# Patient Record
Sex: Male | Born: 1990 | Race: Black or African American | Hispanic: No | Marital: Single | Smoking: Never smoker
Health system: Southern US, Community
[De-identification: ages and names within clinical notes are randomized; demographics above are authoritative.]

## PROBLEM LIST (undated history)

## (undated) DIAGNOSIS — T148XXA Other injury of unspecified body region, initial encounter: Secondary | ICD-10-CM

## (undated) DIAGNOSIS — J45909 Unspecified asthma, uncomplicated: Secondary | ICD-10-CM

## (undated) HISTORY — DX: Unspecified asthma, uncomplicated: J45.909

## (undated) HISTORY — PX: HERNIA REPAIR: SHX51

---
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
## 2010-08-19 ENCOUNTER — Inpatient Hospital Stay (INDEPENDENT_AMBULATORY_CARE_PROVIDER_SITE_OTHER)
Admission: RE | Admit: 2010-08-19 | Discharge: 2010-08-19 | Disposition: A | Discharge status: Home / Self Care | Payer: Self-pay | Source: Ambulatory Visit | Attending: Family Medicine | Admitting: Family Medicine

## 2010-08-19 DIAGNOSIS — N342 Other urethritis: Secondary | ICD-10-CM

## 2010-08-20 LAB — GC/CHLAMYDIA PROBE AMP, GENITAL
Chlamydia, DNA Probe: NEGATIVE
GC Probe Amp, Genital: NEGATIVE

## 2012-10-05 ENCOUNTER — Ambulatory Visit (INDEPENDENT_AMBULATORY_CARE_PROVIDER_SITE_OTHER): Payer: BC Managed Care – PPO | Admitting: Physician Assistant

## 2012-10-05 VITALS — BP 102/75 | HR 68 | Temp 98.2°F | Resp 18 | Ht 67.0 in | Wt 155.0 lb

## 2012-10-05 DIAGNOSIS — R369 Urethral discharge, unspecified: Secondary | ICD-10-CM

## 2012-10-05 MED ORDER — CEFTRIAXONE SODIUM 1 G IJ SOLR: 250.0000 mg | INTRAMUSCULAR | Status: DC

## 2012-10-05 MED ORDER — AZITHROMYCIN 500 MG PO TABS: 1000.0000 mg | ORAL_TABLET | Freq: Once | ORAL | Status: DC

## 2012-10-05 MED ORDER — CEFTRIAXONE SODIUM 1 G IJ SOLR
250.0000 mg | Freq: Once | INTRAMUSCULAR | Status: AC
  Administered 2012-10-05: 250 mg via INTRAMUSCULAR

## 2012-10-05 NOTE — Progress Notes (Signed)
  Subjective:    Patient ID: Roberto Cummings, male    DOB: Nov 08, 1990, 22 y.o.   MRN: [id number]  HPI   Roberto Cummings is a 22 yr old male here with concern for STI.  Has noted discharge from the penis for about a week.  "I think it's chlamydia."  Denies pain or dysuria.  States he has had chlamydia once before.  He is currently sexually active with 1 male partner, states he's been with this partner for about 4 yrs.  States she had a UTI about a week ago and shortly after he began having symptoms.  His partner is being seen today at the health dept.  Pt states he never uses condoms with this partner.  Last tested for HIV, syphilis about 6 months ago.  Does not want to be test for these today.     Review of Systems  Constitutional: Negative for fever and chills.  HENT: Negative.   Respiratory: Negative.   Cardiovascular: Negative.   Gastrointestinal: Negative.   Genitourinary: Positive for discharge. Negative for dysuria and genital sores.  Musculoskeletal: Negative.   Skin: Negative.   Neurological: Negative.        Objective:   Physical Exam  Vitals reviewed. Constitutional: He is oriented to person, place, and time. He appears well-developed and well-nourished. No distress.  HENT:  Head: Normocephalic and atraumatic.  Eyes: Conjunctivae are normal. No scleral icterus.  Cardiovascular: Normal rate, regular rhythm and normal heart sounds.   Pulmonary/Chest: Effort normal. He has no wheezes. He has no rales.  Abdominal: Soft. Bowel sounds are normal. There is no tenderness.  Neurological: He is alert and oriented to person, place, and time.  Skin: Skin is warm and dry.  Psychiatric: He has a normal mood and affect. His behavior is normal.     Filed Vitals:   10/05/12 1523  BP: 102/75  Pulse: 68  Temp: 98.2 F (36.8 C)  Resp: 18        Assessment & Plan:  Penile discharge - Plan: GC/Chlamydia Probe Amp, azithromycin (ZITHROMAX) 500 MG tablet, cefTRIAXone (ROCEPHIN) injection 250  mg, DISCONTINUED: cefTRIAXone (ROCEPHIN) injection 250 mg   Roberto Cummings is a 22 yr old male with one week of penile discharge.  Uriprobe collected today. Pt declined HIV, RPR.  Will treat empirically for both gonorrhea and chlamydia with ceftriaxone and azithro.  Discussed with pt that partner also needs treatment.  Discussed abstinence for the next 7 days to prevent reinfection.  Encouraged condom use with every encounter.  If symptoms worsen or do not improve pt will RTC.

## 2012-10-05 NOTE — Patient Instructions (Addendum)
We have given you an injection of antibiotics here in clinic.  Take the azithromycin today - take the two pills together.  Make sure your partner gets treated as well.  You need to abstain from sex for the next 7 days to avoid reinfection.  Use condoms with every sexual encounter, aside from abstinence this is the only way to protect yourself from sexually transmitted infections.  If your symptoms worsen or do not improve, please let us know,   Sexually Transmitted Disease Sexually transmitted disease (STD) refers to any infection that is passed from person to person during sexual activity. This may happen by way of saliva, semen, blood, vaginal mucus, or urine. Common STDs include:  Gonorrhea.  Chlamydia.  Syphilis.  HIV/AIDS.  Genital herpes.  Hepatitis B and C.  Trichomonas.  Human papillomavirus (HPV).  Pubic lice. CAUSES  An STD may be spread by bacteria, virus, or parasite. A person can get an STD by:  Sexual intercourse with an infected person.  Sharing sex toys with an infected person.  Sharing needles with an infected person.  Having intimate contact with the genitals, mouth, or rectal areas of an infected person. SYMPTOMS  Some people may not have any symptoms, but they can still pass the infection to others. Different STDs have different symptoms. Symptoms include:  Painful or bloody urination.  Pain in the pelvis, abdomen, vagina, anus, throat, or eyes.  Skin rash, itching, irritation, growths, or sores (lesions). These usually occur in the genital or anal area.  Abnormal vaginal discharge.  Penile discharge in men.  Soft, flesh-colored skin growths in the genital or anal area.  Fever.  Pain or bleeding during sexual intercourse.  Swollen glands in the groin area.  Yellow skin and eyes (jaundice). This is seen with hepatitis. DIAGNOSIS  To make a diagnosis, your caregiver may:  Take a medical history.  Perform a physical exam.  Take a specimen  (culture) to be examined.  Examine a sample of discharge under a microscope.  Perform blood tests.  Perform a Pap test, if this applies.  Perform a colposcopy.  Perform a laparoscopy. TREATMENT   Chlamydia, gonorrhea, trichomonas, and syphilis can be cured with antibiotic medicine.  Genital herpes, hepatitis, and HIV can be treated, but not cured, with prescribed medicines. The medicines will lessen the symptoms.  Genital warts from HPV can be treated with medicine or by freezing, burning (electrocautery), or surgery. Warts may come back.  HPV is a virus and cannot be cured with medicine or surgery.However, abnormal areas may be followed very closely by your caregiver and may be removed from the cervix, vagina, or vulva through office procedures or surgery. If your diagnosis is confirmed, your recent sexual partners need treatment. This is true even if they are symptom-free or have a negative culture or evaluation. They should not have sex until their caregiver says it is okay. HOME CARE INSTRUCTIONS  All sexual partners should be informed, tested, and treated for all STDs.  Take your antibiotics as directed. Finish them even if you start to feel better.  Only take over-the-counter or prescription medicines for pain, discomfort, or fever as directed by your caregiver.  Rest.  Eat a balanced diet and drink enough fluids to keep your urine clear or pale yellow.  Do not have sex until treatment is completed and you have followed up with your caregiver. STDs should be checked after treatment.  Keep all follow-up appointments, Pap tests, and blood tests as directed by your  caregiver.  Only use latex condoms and water-soluble lubricants during sexual activity. Do not use petroleum jelly or oils.  Avoid alcohol and illegal drugs.  Get vaccinated for HPV and hepatitis. If you have not received these vaccines in the past, talk to your caregiver about whether one or both might be  right for you.  Avoid risky sex practices that can break the skin. The only way to avoid getting an STD is to avoid all sexual activity.Latex condoms and dental dams (for oral sex) will help lessen the risk of getting an STD, but will not completely eliminate the risk. SEEK MEDICAL CARE IF:   You have a fever.  You have any new or worsening symptoms. Document Released: 09/04/2002 Document Revised: 09/06/2011 Document Reviewed: 09/11/2010 [employer] Patient Information 2013 [place], [place].

## 2012-10-06 LAB — GC/CHLAMYDIA PROBE AMP
CT Probe RNA: NEGATIVE
GC Probe RNA: NEGATIVE

## 2013-03-11 ENCOUNTER — Emergency Department (HOSPITAL_COMMUNITY)
Admission: EM | Admit: 2013-03-11 | Discharge: 2013-03-11 | Discharge status: Against Medical Advice | Payer: 59 | Attending: Emergency Medicine | Admitting: Emergency Medicine

## 2013-03-11 ENCOUNTER — Emergency Department (HOSPITAL_COMMUNITY): Payer: Self-pay

## 2013-03-11 ENCOUNTER — Emergency Department (HOSPITAL_COMMUNITY)
Admission: EM | Admit: 2013-03-11 | Discharge: 2013-03-11 | Disposition: A | Discharge status: Home / Self Care | Payer: Self-pay | Attending: Emergency Medicine | Admitting: Emergency Medicine

## 2013-03-11 ENCOUNTER — Encounter (HOSPITAL_COMMUNITY): Payer: Self-pay | Admitting: Emergency Medicine

## 2013-03-11 ENCOUNTER — Emergency Department (HOSPITAL_COMMUNITY): Payer: 59

## 2013-03-11 DIAGNOSIS — S43016A Anterior dislocation of unspecified humerus, initial encounter: Secondary | ICD-10-CM | POA: Insufficient documentation

## 2013-03-11 DIAGNOSIS — J45909 Unspecified asthma, uncomplicated: Secondary | ICD-10-CM | POA: Insufficient documentation

## 2013-03-11 DIAGNOSIS — Z87891 Personal history of nicotine dependence: Secondary | ICD-10-CM | POA: Insufficient documentation

## 2013-03-11 DIAGNOSIS — S43004A Unspecified dislocation of right shoulder joint, initial encounter: Secondary | ICD-10-CM

## 2013-03-11 MED ORDER — PROPOFOL 10 MG/ML IV BOLUS
0.5000 mg/kg | INTRAVENOUS | Status: DC | PRN
  Administered 2013-03-11: 70.3 mg via INTRAVENOUS
  Administered 2013-03-11: 35 mg via INTRAVENOUS
  Filled 2013-03-11: qty 20

## 2013-03-11 MED ORDER — HYDROMORPHONE HCL PF 1 MG/ML IJ SOLN
1.0000 mg | INTRAMUSCULAR | Status: DC | PRN
  Administered 2013-03-11 (×2): 1 mg via INTRAVENOUS
  Filled 2013-03-11 (×2): qty 1

## 2013-03-11 MED ORDER — HYDROCODONE-ACETAMINOPHEN 5-325 MG PO TABS: 1.0000 | ORAL_TABLET | Freq: Four times a day (QID) | ORAL | Status: DC | PRN

## 2013-03-11 MED ORDER — SODIUM CHLORIDE 0.9 % IV SOLN
INTRAVENOUS | Status: DC
  Administered 2013-03-11: 08:00:00 via INTRAVENOUS
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
## 2013-03-11 NOTE — ED Notes (Signed)
Reduction of R shoulder successful. Pt tolerated without difficulty. Vital signs stable. X-ray at bedside.

## 2013-03-11 NOTE — ED Notes (Signed)
EDP and PA at bedside. Consent for procedure completed. Time out performed. Vital signs stable.

## 2013-03-11 NOTE — ED Notes (Signed)
Pt is alert and responding appropriately. Vital signs stable. Able to cough on command. Answering questions correctly. No signs of acute distress noted. Family at bedside. Returned to baseline. Sedation score of 10.

## 2013-03-11 NOTE — ED Notes (Signed)
Pt sleeping O2sat decreased to 90%ra O2 applied 2LPM

## 2013-03-11 NOTE — ED Notes (Signed)
Pt discharged home. Vital signs stable. Instructed to wear sling until follow up with orthopedic surgeon. Has no further questions at the time.

## 2013-03-11 NOTE — ED Provider Notes (Signed)
R shoulder reduction performed by me under request of Dr. Lynelle Doctor:  Reduction of dislocation Date/Time: 03/11/2013 9:20 AM Performed by: Fayrene Helper Authorized by: Fayrene Helper Consent: Verbal consent obtained. written consent obtained. Risks and benefits: risks, benefits and alternatives were discussed Consent given by: patient Patient understanding: patient states understanding of the procedure being performed Patient consent: the patient's understanding of the procedure matches consent given Procedure consent: procedure consent matches procedure scheduled Relevant documents: relevant documents present and verified Test results: test results available and properly labeled Imaging studies: imaging studies available Patient identity confirmed: verbally with patient and arm band Time out: Immediately prior to procedure a "time out" was called to verify the correct patient, procedure, equipment, support staff and site/side marked as required. Local anesthesia used: no Patient sedated: yes Sedatives: propofol Sedation start date/time: 03/11/2013 9:00 AM Sedation end date/time: 03/11/2013 9:15 AM Vitals: Vital signs were monitored during sedation. Patient tolerance: Patient tolerated the procedure well with no immediate complications. Comments: Reduction performed by me under supervision of Dr. Lynelle Doctor.  Procedural sedation were used.  Technique includes traction/counter traction with internal/external rotation of R upper arm until shoulder was successfully reduced, confirmed on post reduction xray.      Fayrene Helper, PA-C 03/11/13 1103
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
## 2013-05-11 ENCOUNTER — Emergency Department (HOSPITAL_COMMUNITY)
Admission: EM | Admit: 2013-05-11 | Discharge: 2013-05-11 | Disposition: A | Discharge status: Home / Self Care | Payer: BC Managed Care – PPO | Attending: Emergency Medicine | Admitting: Emergency Medicine

## 2013-05-11 ENCOUNTER — Encounter (HOSPITAL_COMMUNITY): Payer: Self-pay | Admitting: Emergency Medicine

## 2013-05-11 DIAGNOSIS — R369 Urethral discharge, unspecified: Secondary | ICD-10-CM

## 2013-05-11 DIAGNOSIS — J45909 Unspecified asthma, uncomplicated: Secondary | ICD-10-CM | POA: Insufficient documentation

## 2013-05-11 DIAGNOSIS — Z87891 Personal history of nicotine dependence: Secondary | ICD-10-CM | POA: Insufficient documentation

## 2013-05-11 MED ORDER — CEFTRIAXONE SODIUM 250 MG IJ SOLR
250.0000 mg | Freq: Once | INTRAMUSCULAR | Status: AC
  Administered 2013-05-11: 250 mg via INTRAMUSCULAR
  Filled 2013-05-11: qty 250

## 2013-05-11 MED ORDER — FLUCONAZOLE 200 MG PO TABS
200.0000 mg | ORAL_TABLET | Freq: Once | ORAL | Status: AC
  Administered 2013-05-11: 200 mg via ORAL
  Filled 2013-05-11: qty 1

## 2013-05-11 MED ORDER — AZITHROMYCIN 250 MG PO TABS
1000.0000 mg | ORAL_TABLET | Freq: Once | ORAL | Status: AC
  Administered 2013-05-11: 1000 mg via ORAL
  Filled 2013-05-11: qty 4

## 2013-05-11 NOTE — ED Notes (Signed)
Pt states that he has white penial discharge for couple of days and bump on penis that is painful. Pt states that his girlfriend has recently been treated for yeast infection.

## 2013-05-11 NOTE — ED Provider Notes (Signed)
CSN: [id number]     Arrival date & time 05/11/13  1026 History   First MD Initiated Contact with Patient 05/11/13 1030     Chief Complaint  Patient presents with  . Penile Discharge   (Consider location/radiation/quality/duration/timing/severity/associated sxs/prior Treatment) HPI   Roberto Cummings is a 22 y.o.male without any significant PMH presents to the ER with complaints of penile discharge for 3 days. His girlfriend told him that she has a yeast infection and he thinks this may be what he has as well because of the discharge. It is a clear/yellow discharge. He has not had dysuria, nausea, vomiting, diarrhea, fevers, abdominal pains.     Past Medical History  Diagnosis Date  . Asthma    History reviewed. No pertinent past surgical history. No family history on file. History  Substance Use Topics  . Smoking status: Former Games developer  . Smokeless tobacco: Not on file  . Alcohol Use: Yes    Review of Systems The patient denies anorexia, fever, weight loss,, vision loss, decreased hearing, hoarseness, chest pain, syncope, dyspnea on exertion, peripheral edema, balance deficits, hemoptysis, abdominal pain, melena, hematochezia, severe indigestion/heartburn, hematuria, incontinence, genital sores, muscle weakness, suspicious skin lesions, transient blindness, difficulty walking, depression, unusual weight change, abnormal bleeding, enlarged lymph nodes, angioedema, and breast masses.  Allergies  Review of patient's allergies indicates no known allergies.  Home Medications   Current Outpatient Rx  Name  Route  Sig  Dispense  Refill  . HYDROcodone-acetaminophen (NORCO) 5-325 MG per tablet   Oral   Take 1-2 tablets by mouth every 6 (six) hours as needed for pain.   30 tablet   0    BP 111/72  Pulse 55  Temp(Src) 98.2 F (36.8 C) (Oral)  Resp 17  SpO2 98% Physical Exam  Nursing note and vitals reviewed. Constitutional: He appears well-developed and well-nourished. No  distress.  HENT:  Head: Normocephalic and atraumatic.  Eyes: Pupils are equal, round, and reactive to light.  Neck: Normal range of motion. Neck supple.  Cardiovascular: Normal rate and regular rhythm.   Pulmonary/Chest: Effort normal.  Abdominal: Soft.  Genitourinary: Testes normal. Circumcised. Discharge found.  Neurological: He is alert.  Skin: Skin is warm and dry.      ED Course  Procedures (including critical care time) Labs Review Labs Reviewed  GC/CHLAMYDIA PROBE AMP   Imaging Review No results found.  EKG Interpretation   None       MDM   1. Penile discharge    Patients infection does not appear to be yeast, but will give a dose of Diflucan. Pt given Azithromycin and Rocephin in ED and told not to have sex with his girlfriend until she gets checked and treated as well. Told he will be called Monday or Tuesday with results.  22 y.o.Roberto Cummings's evaluation in the Emergency Department is complete. It has been determined that no acute conditions requiring further emergency intervention are present at this time. The patient/guardian have been advised of the diagnosis and plan. We have discussed signs and symptoms that warrant return to the ED, such as changes or worsening in symptoms.  Vital signs are stable at discharge. Filed Vitals:   05/11/13 1041  BP:   Pulse:   Temp: 98.2 F (36.8 C)  Resp:     Patient/guardian has voiced understanding and agreed to follow-up with the PCP or specialist.      Dorthula Matas, PA-C 05/11/13 1100

## 2013-05-11 NOTE — Progress Notes (Signed)
   CARE MANAGEMENT ED NOTE 05/11/2013  Patient:  Roberto Cummings, Roberto Cummings   Account Number:  [id number]  Date Initiated:  05/11/2013  Documentation initiated by:  Edd Arbour  Subjective/Objective Assessment:   22 yr old [facility] [place] pt c/o white penial discharge for couple of days and bump on penis that is painful No pcp     Subjective/Objective Assessment Detail:   Pt voiced understanding of information CM discussed with him Pt reports he is covered by his mother coverage     Action/Plan:   CM spoke with pt and discussed how he can find an in network pcp using [facility] website or customer service number for follow up services   Action/Plan Detail:   Anticipated DC Date:  05/11/2013     Status Recommendation to Physician:   Result of Recommendation:    Other ED Services  Consult Working Plan    DC Planning Services  Other  PCP issues  Outpatient Services - Pt will follow up    Choice offered to / List presented to:            Status of service:  Completed, signed off  ED Comments:   ED Comments Detail:

## 2013-05-11 NOTE — ED Provider Notes (Signed)
Medical screening examination/treatment/procedure(s) were performed by non-physician practitioner and as supervising physician I was immediately available for consultation/collaboration.  EKG Interpretation   None         Audree Camel, MD 05/11/13 1252

## 2013-05-16 NOTE — ED Notes (Signed)
+  gonorrhea Patient treated with Rocephin And Zithromax-[facility] faxed 

## 2013-09-17 ENCOUNTER — Other Ambulatory Visit (HOSPITAL_COMMUNITY)
Admission: RE | Admit: 2013-09-17 | Discharge: 2013-09-17 | Disposition: A | Discharge status: Home / Self Care | Payer: BC Managed Care – PPO | Source: Ambulatory Visit | Attending: Emergency Medicine | Admitting: Emergency Medicine

## 2013-09-17 ENCOUNTER — Encounter (HOSPITAL_COMMUNITY): Payer: Self-pay | Admitting: Emergency Medicine

## 2013-09-17 ENCOUNTER — Emergency Department (HOSPITAL_COMMUNITY)
Admission: EM | Admit: 2013-09-17 | Discharge: 2013-09-17 | Disposition: A | Discharge status: Home / Self Care | Payer: BC Managed Care – PPO | Source: Home / Self Care | Attending: Emergency Medicine | Admitting: Emergency Medicine

## 2013-09-17 DIAGNOSIS — Z113 Encounter for screening for infections with a predominantly sexual mode of transmission: Secondary | ICD-10-CM | POA: Insufficient documentation

## 2013-09-17 DIAGNOSIS — N342 Other urethritis: Secondary | ICD-10-CM

## 2013-09-17 MED ORDER — AZITHROMYCIN 250 MG PO TABS
1000.0000 mg | ORAL_TABLET | Freq: Every day | ORAL | Status: DC
  Administered 2013-09-17: 1000 mg via ORAL

## 2013-09-17 MED ORDER — CEFTRIAXONE SODIUM 250 MG IJ SOLR
INTRAMUSCULAR | Status: AC
  Filled 2013-09-17: qty 250

## 2013-09-17 MED ORDER — CEFTRIAXONE SODIUM 250 MG IJ SOLR
250.0000 mg | Freq: Once | INTRAMUSCULAR | Status: AC
  Administered 2013-09-17: 250 mg via INTRAMUSCULAR

## 2013-09-17 MED ORDER — AZITHROMYCIN 250 MG PO TABS
ORAL_TABLET | ORAL | Status: AC
  Filled 2013-09-17: qty 4

## 2013-09-17 MED ORDER — LIDOCAINE HCL (PF) 1 % IJ SOLN
INTRAMUSCULAR | Status: AC
  Filled 2013-09-17: qty 5

## 2013-09-17 NOTE — Discharge Instructions (Signed)
Urethritis, Adult Urethritis is an inflammation of the tube through which urine exits your bladder (urethra).  CAUSES Urethritis is often caused by an infection in your urethra. The infection can be viral, like herpes. The infection can also be bacterial, like gonorrhea. RISK FACTORS Risk factors of urethritis include:  Having sex without using a condom.  Having multiple sexual partners.  Having poor hygiene. SIGNS AND SYMPTOMS Symptoms of urethritis are less noticeable in women than in men. These symptoms include:  Burning feeling when you urinate (dysuria).  Discharge from your urethra.  Blood in your urine (hematuria).  Urinating more than usual. DIAGNOSIS  To confirm a diagnosis of urethritis, your health care provider will do the following:  Ask about your sexual history.  Perform a physical exam.  Have you provide a sample of your urine for lab testing.  Use a cotton swab to gently collect a sample from your urethra for lab testing. TREATMENT  It is important to treat urethritis. Depending on the cause, untreated urethritis may lead to serious genital infections and possibly infertility. Urethritis caused by a bacterial infection is treated with antibiotics. All sexual partners must be treated.  HOME CARE INSTRUCTIONS  Do not have sex until the test results are known and treatment is completed, even if your symptoms go away before you finish treatment.  Finish all medicines that you are prescribed. SEEK MEDICAL CARE IF:   Your symptoms are not improved in 3 days.  Your symptoms are getting worse.  You develop abdominal pain or pelvic pain (in women).  You develop joint pain. SEEK IMMEDIATE MEDICAL CARE IF:   You have a fever with a temperature of 101.41F (38.8C) or greater.  You have severe pain in the belly, back, or side.  You have repeated vomiting. Document Released: 12/08/2000 Document Revised: 04/04/2013 Document Reviewed: 02/12/2013 [facility]  Patient Information 2014 [place], [place].  Sexually Transmitted Disease A sexually transmitted disease (STD) is a disease or infection that may be passed (transmitted) from person to person, usually during sexual activity. This may happen by way of saliva, semen, blood, vaginal mucus, or urine. Common STDs include:   Gonorrhea.   Chlamydia.   Syphilis.   HIV and AIDS.   Genital herpes.   Hepatitis B and C.   Trichomonas.   Human papillomavirus (HPV).   Pubic lice.   Scabies.  Mites.  Bacterial vaginosis. WHAT ARE CAUSES OF STDs? An STD may be caused by bacteria, a virus, or parasites. STDs are often transmitted during sexual activity if one person is infected. However, they may also be transmitted through nonsexual means. STDs may be transmitted after:   Sexual intercourse with an infected person.   Sharing sex toys with an infected person.   Sharing needles with an infected person or using unclean piercing or tattoo needles.  Having intimate contact with the genitals, mouth, or rectal areas of an infected person.   Exposure to infected fluids during birth. WHAT ARE THE SIGNS AND SYMPTOMS OF STDs? Different STDs have different symptoms. Some people may not have any symptoms. If symptoms are present, they may include:   Painful or bloody urination.   Pain in the pelvis, abdomen, vagina, anus, throat, or eyes.   Skin rash, itching, irritation, growths, sores (lesions), ulcerations, or warts in the genital or anal area.  Abnormal vaginal discharge with or without bad odor.   Penile discharge in men.   Fever.   Pain or bleeding during sexual intercourse.   Swollen glands  in the groin area.   Yellow skin and eyes (jaundice). This is seen with hepatitis.   Swollen testicles.  Infertility.  Sores and blisters in the mouth. HOW ARE STDs DIAGNOSED? To make a diagnosis, your health care provider may:   Take a medical history.   Perform  a physical exam.   Take a sample of any discharge for examination.  Swab the throat, cervix, opening to the penis, rectum, or vagina for testing.  Test a sample of your first morning urine.   Perform blood tests.   Perform a Pap smear, if this applies.   Perform a colposcopy.   Perform a laparoscopy.  HOW ARE STDs TREATED? Treatment depends on the STD. Some STDs may be treated but not cured.   Chlamydia, gonorrhea, trichomonas, and syphilis can be cured with antibiotics.   Genital herpes, hepatitis, and HIV can be treated, but not cured, with prescribed medicines. The medicines lessen symptoms.   Genital warts from HPV can be treated with medicine or by freezing, burning (electrocautery), or surgery. Warts may come back.   HPV cannot be cured with medicine or surgery. However, abnormal areas may be removed from the cervix, vagina, or vulva.   If your diagnosis is confirmed, your recent sexual partners need treatment. This is true even if they are symptom-free or have a negative culture or evaluation. They should not have sex until their health care providers say it is OK. HOW CAN I REDUCE MY RISK OF GETTING AN STD?  Use latex condoms, dental dams, and water-soluble lubricants during sexual activity. Do not use petroleum jelly or oils.  Get vaccinated for HPV and hepatitis. If you have not received these vaccines in the past, talk to your health care provider about whether one or both might be right for you.   Avoid risky sex practices that can break the skin.  WHAT SHOULD I DO IF I THINK I HAVE AN STD?  See your health care provider.   Inform all sexual partners. They should be tested and treated for any STDs.  Do not have sex until your health care provider says it is OK. WHEN SHOULD I GET HELP? Seek immediate medical care if:  You develop severe abdominal pain.  You are a man and notice swelling or pain in the testicles.  You are a woman and notice  swelling or pain in your vagina. Document Released: 09/04/2002 Document Revised: 04/04/2013 Document Reviewed: 01/02/2013 [facility] Patient Information 2014 [place]ExitCare, [place]LLC.

## 2013-09-17 NOTE — ED Provider Notes (Signed)
CSN: [id number]     Arrival date & time 09/17/13  1546 History   First MD Initiated Contact with Patient 09/17/13 1742     No chief complaint on file.  (Consider location/radiation/quality/duration/timing/severity/associated sxs/prior Treatment) HPI Comments: 23 year old male presents for evaluation of penile discharge for 1 week. He reports that he "stepped out" on his girlfriend with another male and the condom broke. He reports some mild dysuria as well. No other systemic symptoms. No testicular pain or abdominal pains.   Past Medical History  Diagnosis Date  . Asthma    History reviewed. No pertinent past surgical history. No family history on file. History  Substance Use Topics  . Smoking status: Former Games developermoker  . Smokeless tobacco: Not on file  . Alcohol Use: Yes    Review of Systems  Genitourinary: Positive for dysuria and discharge. Negative for urgency, frequency, hematuria, flank pain, penile swelling, scrotal swelling, enuresis, difficulty urinating, genital sores, penile pain and testicular pain.  All other systems reviewed and are negative.    Allergies  Review of patient's allergies indicates no known allergies.  Home Medications   Current Outpatient Rx  Name  Route  Sig  Dispense  Refill  . HYDROcodone-acetaminophen (NORCO) 5-325 MG per tablet   Oral   Take 1-2 tablets by mouth every 6 (six) hours as needed for pain.   30 tablet   0    BP 128/81  Pulse 50  Temp(Src) 98.5 F (36.9 C) (Oral)  Resp 16  SpO2 100% Physical Exam  Nursing note and vitals reviewed. Constitutional: He is oriented to person, place, and time. He appears well-developed and well-nourished. No distress.  HENT:  Head: Normocephalic.  Pulmonary/Chest: Effort normal. No respiratory distress.  Genitourinary: Testes normal. Right testis shows no swelling and no tenderness. Left testis shows no swelling and no tenderness. Discharge (thin white) found.  Lymphadenopathy:       Right:  Inguinal adenopathy present.       Left: Inguinal adenopathy present.  Neurological: He is alert and oriented to person, place, and time. Coordination normal.  Skin: Skin is warm and dry. No rash noted. He is not diaphoretic.  Psychiatric: He has a normal mood and affect. Judgment normal.    ED Course  Procedures (including critical care time) Labs Review Labs Reviewed  RPR  HIV ANTIBODY (ROUTINE TESTING)  URINE CYTOLOGY ANCILLARY ONLY   Imaging Review No results found.   MDM   1. Urethritis    Treated with azithromycin and ceftriaxone here. Urine cytology sent. HIV and RPR sent. Followup when necessary if symptoms do not resolve.   Meds ordered this encounter  Medications  . azithromycin (ZITHROMAX) tablet 1,000 mg    Sig:   . cefTRIAXone (ROCEPHIN) injection 250 mg    Sig:        Graylon GoodZachary H Daniela Siebers, PA-C 09/17/13 1801

## 2013-09-17 NOTE — ED Notes (Signed)
23 yr old male is here with complaints of penile discharge for one week. He states he stepped out on his girlfriend with another male, condom was used but broke. He states the discharge is whitish. Denies: SOB, Chest pain

## 2013-09-18 LAB — RPR: RPR: NONREACTIVE

## 2013-09-18 LAB — URINE CYTOLOGY ANCILLARY ONLY
CHLAMYDIA, DNA PROBE: POSITIVE — AB
NEISSERIA GONORRHEA: NEGATIVE
TRICH (WINDOWPATH): NEGATIVE

## 2013-09-18 LAB — HIV ANTIBODY (ROUTINE TESTING W REFLEX): HIV: NONREACTIVE

## 2013-09-18 NOTE — ED Provider Notes (Signed)
Medical screening examination/treatment/procedure(s) were performed by non-physician practitioner and as supervising physician I was immediately available for consultation/collaboration.  Leslee Homeavid Azrael Maddix, M.D.   Reuben Likesavid C Alif Petrak, MD 09/18/13 [phone]

## 2013-09-20 ENCOUNTER — Telehealth (HOSPITAL_COMMUNITY): Payer: Self-pay | Admitting: *Deleted

## 2013-09-20 NOTE — ED Notes (Addendum)
GC neg., Chlamydia pos., Trich neg., HIV/RPR non-reactive. Pt. adequately treated with Zithromax.  I called pt. but mailbox is full- unable to leave a message. Call 1. DHHS [facility] Roberto Cummings, Roberto Cummings 09/20/2013 Call 2.  09/21/2013 Pt. called back.  Pt. verified x 2 and given results.  Pt. told he was adequately treated with Zithromax.  Pt. instructed to notify his partners, no sex for 1 week and to practice safe sex. Pt. told he should [facility], by appointment, since he was pos. for an STD.  Pt. voiced understanding. Roberto Cummings, Roberto Cummings 09/21/2013

## 2014-08-23 ENCOUNTER — Emergency Department (HOSPITAL_COMMUNITY): Payer: BLUE CROSS/BLUE SHIELD

## 2014-08-23 ENCOUNTER — Encounter (HOSPITAL_COMMUNITY): Payer: Self-pay | Admitting: *Deleted

## 2014-08-23 ENCOUNTER — Emergency Department (HOSPITAL_COMMUNITY)
Admission: EM | Admit: 2014-08-23 | Discharge: 2014-08-23 | Disposition: A | Discharge status: Home / Self Care | Payer: BLUE CROSS/BLUE SHIELD | Attending: Emergency Medicine | Admitting: Emergency Medicine

## 2014-08-23 DIAGNOSIS — S29001A Unspecified injury of muscle and tendon of front wall of thorax, initial encounter: Secondary | ICD-10-CM | POA: Insufficient documentation

## 2014-08-23 DIAGNOSIS — S298XXA Other specified injuries of thorax, initial encounter: Secondary | ICD-10-CM

## 2014-08-23 DIAGNOSIS — J45909 Unspecified asthma, uncomplicated: Secondary | ICD-10-CM | POA: Diagnosis not present

## 2014-08-23 DIAGNOSIS — Y9289 Other specified places as the place of occurrence of the external cause: Secondary | ICD-10-CM | POA: Insufficient documentation

## 2014-08-23 DIAGNOSIS — Y998 Other external cause status: Secondary | ICD-10-CM | POA: Insufficient documentation

## 2014-08-23 DIAGNOSIS — Z87891 Personal history of nicotine dependence: Secondary | ICD-10-CM | POA: Diagnosis not present

## 2014-08-23 DIAGNOSIS — Y9383 Activity, rough housing and horseplay: Secondary | ICD-10-CM | POA: Diagnosis not present

## 2014-08-23 DIAGNOSIS — X58XXXA Exposure to other specified factors, initial encounter: Secondary | ICD-10-CM | POA: Insufficient documentation

## 2014-08-23 NOTE — ED Notes (Signed)
Dr. Wickline at bedside.  

## 2014-08-23 NOTE — ED Provider Notes (Signed)
CSN: [id number]     Arrival date & time 08/23/14  [id number] History   First MD Initiated Contact with Patient 08/23/14 0725     Chief Complaint  Patient presents with  . Flank Pain    RIB pain    Patient is a 24 y.o. male presenting with chest pain. The history is provided by the patient.  Chest Pain Pain location:  L chest Pain quality: aching   Pain radiates to:  Does not radiate Pain severity:  Moderate Onset quality:  Gradual Duration:  1 day Timing:  Intermittent Progression:  Unchanged Chronicity:  New Context comment:  "horse playing" Relieved by:  Rest Worsened by:  Coughing, movement and certain positions Associated symptoms: cough   Associated symptoms: no abdominal pain, no fever, no lower extremity edema, no shortness of breath, no syncope, not vomiting and no weakness   Associated symptoms comment:  Denies hemoptysis  Risk factors: no coronary artery disease and no prior DVT/PE    Patient reports "horseplaying" with friend yesterday and soon after noticed pain in left side of chest No other injury report Pain is worse with movement/coughing He has no other symptoms    Past Medical History  Diagnosis Date  . Asthma     No longer    No family history on file. History  Substance Use Topics  . Smoking status: Former Games developermoker  . Smokeless tobacco: Not on file  . Alcohol Use: Yes    Review of Systems  Constitutional: Negative for fever.  Respiratory: Positive for cough. Negative for shortness of breath.   Cardiovascular: Positive for chest pain. Negative for syncope.  Gastrointestinal: Negative for vomiting and abdominal pain.  Genitourinary: Negative for dysuria.  Neurological: Negative for syncope and weakness.  All other systems reviewed and are negative.     Allergies  Review of patient's allergies indicates no known allergies.  Home Medications   Prior to Admission medications   Not on File   BP 122/78 mmHg  Pulse 43  Temp(Src) 97.7 F (36.5  C) (Oral)  Resp 23  Ht 5\' 7"  (1.702 m)  Wt 160 lb (72.576 kg)  BMI 25.05 kg/m2  SpO2 100% Physical Exam CONSTITUTIONAL: Well developed/well nourished HEAD: Normocephalic/atraumatic EYES: EOMI/PERRL ENMT: Mucous membranes moist NECK: supple no meningeal signs SPINE/BACK:entire spine nontender CV: S1/S2 noted, no murmurs/rubs/gallops noted Chest - mild tenderness to left chest.  No bruising/crepitus noted LUNGS: Lungs are clear to auscultation bilaterally, no apparent distress ABDOMEN: soft, nontender, no rebound or guarding, bowel sounds noted throughout abdomen GU:no cva tenderness NEURO: Pt is awake/alert/appropriate, moves all extremitiesx4.  No facial droop.   EXTREMITIES: pulses normal/equal, full ROM, no LE edema and no calf tenderness SKIN: warm, color normal PSYCH: no abnormalities of mood noted, alert and oriented to situation  ED Course  Procedures   Patient declined pain meds He is in no distress Bradycardia noted but likely physiologic He only has pain with movement/palpation I doubt ACS/PE/Dissection Advised NSAID use He is appropriate for d/c home  Imaging Review Dg Chest 2 View  08/23/2014   CLINICAL DATA:  Left chest pain  EXAM: CHEST  2 VIEW  COMPARISON:  03/11/2013 heart size is mildly enlarged. Normal vascularity.  Negative for pneumonia. No mass or adenopathy. Minimal pleural effusion bilaterally has developed since prior study.  FINDINGS: Prominent heart size  Minimal pleural effusion bilaterally.  Negative for pneumonia.  IMPRESSION: No active cardiopulmonary disease.   Electronically Signed   By: Marlan Palauharles  Clark M.D.  On: 08/23/2014 08:07     EKG Interpretation   Date/Time:  Friday August 23 2014 07:54:31 EST Ventricular Rate:  43 PR Interval:  123 QRS Duration: 85 QT Interval:  432 QTC Calculation: 365 R Axis:   61 Text Interpretation:  Sinus bradycardia Non-specific ST-t changes No  previous ECGs available Confirmed by Bebe Shaggy  MD, Dorinda Hill  [phone]) on  08/23/2014 8:07:18 AM      MDM   Final diagnoses:  Blunt chest trauma, initial encounter    Nursing notes including past medical history and social history reviewed and considered in documentation xrays/imaging reviewed by myself and considered during evaluation      Joya Gaskins, MD 08/23/14 [phone]

## 2014-08-23 NOTE — Discharge Instructions (Signed)
Blunt Chest Trauma °Blunt chest trauma is an injury caused by a blow to the chest. These chest injuries can be very painful. Blunt chest trauma often results in bruised or broken (fractured) ribs. Most cases of bruised and fractured ribs from blunt chest traumas get better after 1 to 3 weeks of rest and pain medicine. Often, the soft tissue in the chest wall is also injured, causing pain and bruising. Internal organs, such as the heart and lungs, may also be injured. Blunt chest trauma can lead to serious medical problems. This injury requires immediate medical care. °CAUSES  °· Motor vehicle collisions. °· Falls. °· Physical violence. °· Sports injuries. °SYMPTOMS  °· Chest pain. The pain may be worse when you move or breathe deeply. °· Shortness of breath. °· Lightheadedness. °· Bruising. °· Tenderness. °· Swelling. °DIAGNOSIS  °Your caregiver will do a physical exam. X-rays may be taken to look for fractures. However, minor rib fractures may not show up on X-rays until a few days after the injury. If a more serious injury is suspected, further imaging tests may be done. This may include ultrasounds, computed tomography (CT) scans, or magnetic resonance imaging (MRI). °TREATMENT  °Treatment depends on the severity of your injury. Your caregiver may prescribe pain medicines and deep breathing exercises. °HOME CARE INSTRUCTIONS °· Limit your activities until you can move around without much pain. °· Do not do any strenuous work until your injury is healed. °· Put ice on the injured area. °¨ Put ice in a plastic bag. °¨ Place a towel between your skin and the bag. °¨ Leave the ice on for 15-20 minutes, 03-04 times a day. °· You may wear a rib belt as directed by your caregiver to reduce pain. °· Practice deep breathing as directed by your caregiver to keep your lungs clear. °· Only take over-the-counter or prescription medicines for pain, fever, or discomfort as directed by your caregiver. °SEEK IMMEDIATE MEDICAL  CARE IF:  °· You have increasing pain or shortness of breath. °· You cough up blood. °· You have nausea, vomiting, or abdominal pain. °· You have a fever. °· You feel dizzy, weak, or you faint. °MAKE SURE YOU: °· Understand these instructions. °· Will watch your condition. °· Will get help right away if you are not doing well or get worse. °Document Released: 07/22/2004 Document Revised: 09/06/2011 Document Reviewed: 03/31/2011 °ExitCare® Patient Information ©2015 [employer], LLC. This information is not intended to replace advice given to you by your health care provider. Make sure you discuss any questions you have with your health care provider. ° ° °

## 2014-08-23 NOTE — ED Notes (Signed)
Pt states he was wrestling with friend yesterday. Began feeling pain to L lat chest/ribs several hours later.  Denies having it hit or falling on object.  Pain increases with palpation.  No sob or pain on inspiration.

## 2016-06-13 ENCOUNTER — Emergency Department (HOSPITAL_COMMUNITY)
Admission: EM | Admit: 2016-06-13 | Discharge: 2016-06-13 | Disposition: A | Discharge status: Home / Self Care | Payer: BLUE CROSS/BLUE SHIELD | Attending: Emergency Medicine | Admitting: Emergency Medicine

## 2016-06-13 ENCOUNTER — Encounter (HOSPITAL_COMMUNITY): Payer: Self-pay

## 2016-06-13 DIAGNOSIS — Z202 Contact with and (suspected) exposure to infections with a predominantly sexual mode of transmission: Secondary | ICD-10-CM | POA: Insufficient documentation

## 2016-06-13 DIAGNOSIS — J45909 Unspecified asthma, uncomplicated: Secondary | ICD-10-CM | POA: Insufficient documentation

## 2016-06-13 DIAGNOSIS — Z87891 Personal history of nicotine dependence: Secondary | ICD-10-CM | POA: Insufficient documentation

## 2016-06-13 MED ORDER — AZITHROMYCIN 250 MG PO TABS
1000.0000 mg | ORAL_TABLET | Freq: Once | ORAL | Status: AC
  Administered 2016-06-13: 1000 mg via ORAL
  Filled 2016-06-13: qty 4

## 2016-06-13 MED ORDER — STERILE WATER FOR INJECTION IJ SOLN
INTRAMUSCULAR | Status: AC
  Administered 2016-06-13: 10 mL
  Filled 2016-06-13: qty 10

## 2016-06-13 MED ORDER — CEFTRIAXONE SODIUM 250 MG IJ SOLR
250.0000 mg | INTRAMUSCULAR | Status: DC
  Administered 2016-06-13: 250 mg via INTRAMUSCULAR
  Filled 2016-06-13 (×2): qty 250

## 2016-06-13 NOTE — ED Notes (Signed)
Patient is A & O x4.  He understood discharge instructions 

## 2016-06-13 NOTE — ED Triage Notes (Signed)
Pt presents with c/o exposure to STD. Pt reports he has been with someone who has a positive chlamydia result and is now having penile discharge.

## 2016-06-13 NOTE — ED Provider Notes (Signed)
[facility]-EMERGENCY DEPT Provider Note   CSN: [id number] Arrival date & time: 06/13/16  0807     History   Chief Complaint Chief Complaint  Patient presents with  . Exposure to STD    HPI Roberto Cummings is a 25 y.o. male. He presents requesting treatment for STD exposure. He received a call from sexual partner that she was positive for Chlamydia. He's been having discharge for the last 2 days. Minimal dysuria. No sores or blisters. No swelling in the groin. No additional symptoms.  HPI  Past Medical History:  Diagnosis Date  . Asthma    No longer    There are no active problems to display for this patient.   History reviewed. No pertinent surgical history.     Home Medications    Prior to Admission medications   Not on File    Family History No family history on file.  Social History Social History  Substance Use Topics  . Smoking status: Former Games developermoker  . Smokeless tobacco: Not on file  . Alcohol use Yes     Allergies   Patient has no known allergies.   Review of Systems Review of Systems  Constitutional: Negative for appetite change, chills, diaphoresis, fatigue and fever.  HENT: Negative for mouth sores, sore throat and trouble swallowing.   Eyes: Negative for visual disturbance.  Respiratory: Negative for cough, chest tightness, shortness of breath and wheezing.   Cardiovascular: Negative for chest pain.  Gastrointestinal: Negative for abdominal distention, abdominal pain, diarrhea, nausea and vomiting.  Endocrine: Negative for polydipsia, polyphagia and polyuria.  Genitourinary: Positive for discharge. Negative for dysuria, frequency and hematuria.  Musculoskeletal: Negative for gait problem.  Skin: Negative for color change, pallor and rash.  Neurological: Negative for dizziness, syncope, light-headedness and headaches.  Hematological: Does not bruise/bleed easily.  Psychiatric/Behavioral: Negative for behavioral problems and confusion.      Physical Exam Updated Vital Signs BP 154/74 (BP Location: Right Arm)   Pulse (!) 55   Temp 98.3 F (36.8 C) (Oral)   Resp 18   Ht 5\' 8"  (1.727 m)   Wt 150 lb (68 kg)   SpO2 100%   BMI 22.81 kg/m   Physical Exam  Constitutional: He is oriented to person, place, and time. He appears well-developed and well-nourished. No distress.  HENT:  Head: Normocephalic.  Eyes: Conjunctivae are normal. Pupils are equal, round, and reactive to light. No scleral icterus.  Neck: Normal range of motion. Neck supple. No thyromegaly present.  Cardiovascular: Normal rate and regular rhythm.  Exam reveals no gallop and no friction rub.   No murmur heard. Pulmonary/Chest: Effort normal and breath sounds normal. No respiratory distress. He has no wheezes. He has no rales.  Abdominal: Soft. Bowel sounds are normal. He exhibits no distension. There is no tenderness. There is no rebound.  Genitourinary:  Genitourinary Comments: Normal genitalia and adnexa  Musculoskeletal: Normal range of motion.  Neurological: He is alert and oriented to person, place, and time.  Skin: Skin is warm and dry. No rash noted.  Psychiatric: He has a normal mood and affect. His behavior is normal.     ED Treatments / Results  Labs (all labs ordered are listed, but only abnormal results are displayed) Labs Reviewed - No data to display  EKG  EKG Interpretation None       Radiology No results found.  Procedures Procedures (including critical care time)  Medications Ordered in ED Medications  cefTRIAXone (ROCEPHIN) injection 250 mg (not  administered)  azithromycin (ZITHROMAX) tablet 1,000 mg (not administered)     Initial Impression / Assessment and Plan / ED Course  I have reviewed the triage vital signs and the nursing notes.  Pertinent labs & imaging results that were available during my care of the patient were reviewed by me and considered in my medical decision making (see chart for  details).  Clinical Course     Rocephin 250 IM. Zithromax 1 g by mouth. Patient declined additional testing including HIV.  Final Clinical Impressions(s) / ED Diagnoses   Final diagnoses:  STD exposure    New Prescriptions New Prescriptions   No medications on file     Rolland PorterMark Leticia Mcdiarmid, MD 06/13/16 0840

## 2016-08-20 ENCOUNTER — Encounter (HOSPITAL_COMMUNITY): Payer: Self-pay

## 2016-08-20 ENCOUNTER — Ambulatory Visit (HOSPITAL_COMMUNITY)
Admission: EM | Admit: 2016-08-20 | Discharge: 2016-08-20 | Disposition: A | Discharge status: Home / Self Care | Payer: Self-pay | Attending: Family Medicine | Admitting: Family Medicine

## 2016-08-20 DIAGNOSIS — R369 Urethral discharge, unspecified: Secondary | ICD-10-CM | POA: Insufficient documentation

## 2016-08-20 DIAGNOSIS — Z87891 Personal history of nicotine dependence: Secondary | ICD-10-CM | POA: Insufficient documentation

## 2016-08-20 DIAGNOSIS — J069 Acute upper respiratory infection, unspecified: Secondary | ICD-10-CM | POA: Insufficient documentation

## 2016-08-20 DIAGNOSIS — J45909 Unspecified asthma, uncomplicated: Secondary | ICD-10-CM | POA: Insufficient documentation

## 2016-08-20 DIAGNOSIS — A64 Unspecified sexually transmitted disease: Secondary | ICD-10-CM | POA: Insufficient documentation

## 2016-08-20 DIAGNOSIS — R300 Dysuria: Secondary | ICD-10-CM | POA: Insufficient documentation

## 2016-08-20 DIAGNOSIS — B9789 Other viral agents as the cause of diseases classified elsewhere: Secondary | ICD-10-CM

## 2016-08-20 DIAGNOSIS — J029 Acute pharyngitis, unspecified: Secondary | ICD-10-CM | POA: Insufficient documentation

## 2016-08-20 MED ORDER — CEFTRIAXONE SODIUM 250 MG IJ SOLR
INTRAMUSCULAR | Status: AC
  Filled 2016-08-20: qty 250

## 2016-08-20 MED ORDER — AZITHROMYCIN 250 MG PO TABS
ORAL_TABLET | ORAL | Status: AC
  Filled 2016-08-20: qty 4

## 2016-08-20 MED ORDER — LIDOCAINE HCL (PF) 1 % IJ SOLN
INTRAMUSCULAR | Status: AC
  Filled 2016-08-20: qty 2

## 2016-08-20 MED ORDER — CEFTRIAXONE SODIUM 250 MG IJ SOLR
250.0000 mg | Freq: Once | INTRAMUSCULAR | Status: AC
  Administered 2016-08-20: 250 mg via INTRAMUSCULAR

## 2016-08-20 MED ORDER — AZITHROMYCIN 250 MG PO TABS
1000.0000 mg | ORAL_TABLET | Freq: Once | ORAL | Status: AC
  Administered 2016-08-20: 1000 mg via ORAL

## 2016-08-20 NOTE — ED Triage Notes (Signed)
Sore throat since wed. Along with runny nose, sneezing and coughing. Hot and cold spells. No fever. No otc meds taken.

## 2016-08-20 NOTE — ED Triage Notes (Signed)
Also has a penile discharge since wed[place]. Having a yellowish discharge, burns when he urinate but no other symptoms.

## 2016-08-20 NOTE — ED Provider Notes (Signed)
CSN: [id number]     Arrival date & time 08/20/16  1011 History   First MD Initiated Contact with Patient 08/20/16 1104     Chief Complaint  Patient presents with  . Sore Throat  . Penile Discharge   (Consider location/radiation/quality/duration/timing/severity/associated sxs/prior Treatment) 26 year old male presents to clinic with 4-5 day history of dysuria, and penile discharge. He denies any flank pain, nausea, vomiting, fever, or abdominal pain. He reports he is sexually active, does not consistently use condoms, denies any rashes, lesions, sores or other external complaints involving penis, scrotum, or groin area.  He also has complains of URI type symptoms such as congestion, runny nose, sore throat, and cough for 2-3 days. Cough is dry, hacking, nonproductive. He denies fever, muscle aches, bodyaches, or headaches, he denies any loss of appetite, weakness, or other symptoms.   The history is provided by the patient.    Past Medical History:  Diagnosis Date  . Asthma    No longer   History reviewed. No pertinent surgical history. No family history on file. Social History  Substance Use Topics  . Smoking status: Former Games developermoker  . Smokeless tobacco: Never Used  . Alcohol use Yes    Review of Systems  Reason unable to perform ROS: As covered in history of present illness.  All other systems reviewed and are negative.   Allergies  Patient has no known allergies.  Home Medications   Prior to Admission medications   Not on File   Meds Ordered and Administered this Visit   Medications  cefTRIAXone (ROCEPHIN) injection 250 mg (250 mg Intramuscular Given 08/20/16 1122)  azithromycin (ZITHROMAX) tablet 1,000 mg (1,000 mg Oral Given 08/20/16 1122)    BP 144/84 (BP Location: Right Arm)   Pulse 67   Temp 99 F (37.2 C) (Oral)   Resp 20   SpO2 96%  No data found.   Physical Exam  Constitutional: He is oriented to person, place, and time. He appears well-developed and  well-nourished. No distress.  HENT:  Head: Normocephalic and atraumatic.  Right Ear: Tympanic membrane and external ear normal.  Left Ear: Tympanic membrane and external ear normal.  Nose: Rhinorrhea present. Right sinus exhibits no maxillary sinus tenderness and no frontal sinus tenderness. Left sinus exhibits no maxillary sinus tenderness and no frontal sinus tenderness.  Mouth/Throat: Uvula is midline, oropharynx is clear and moist and mucous membranes are normal. No oropharyngeal exudate, posterior oropharyngeal edema or posterior oropharyngeal erythema. Tonsils are 2+ on the right. Tonsils are 2+ on the left. No tonsillar exudate.  Eyes: Pupils are equal, round, and reactive to light.  Neck: Normal range of motion. Neck supple. No JVD present.  Cardiovascular: Normal rate and regular rhythm.   Pulmonary/Chest: Effort normal and breath sounds normal. No respiratory distress. He has no wheezes.  Abdominal: Soft. Bowel sounds are normal.  Genitourinary:  Genitourinary Comments: Deferred, urine cyto collected.  Lymphadenopathy:       Head (right side): Tonsillar adenopathy present. No submental, no submandibular and no preauricular adenopathy present.       Head (left side): Tonsillar adenopathy present. No submental, no submandibular and no preauricular adenopathy present.    He has no cervical adenopathy.  Neurological: He is alert and oriented to person, place, and time.  Skin: Skin is warm and dry. Capillary refill takes less than 2 seconds. No rash noted. He is not diaphoretic. No erythema.  Psychiatric: He has a normal mood and affect.  Nursing note and vitals reviewed.  Urgent Care Course     Procedures (including critical care time)  Labs Review Labs Reviewed  URINE CYTOLOGY ANCILLARY ONLY    Imaging Review No results found.   Visual Acuity Review  Right Eye Distance:   Left Eye Distance:   Bilateral Distance:    Right Eye Near:   Left Eye Near:    Bilateral  Near:         MDM   1. Viral upper respiratory tract infection   2. STD (male)    You most likely have a viral URI, I advise rest, plenty of fluids and management of symptoms with over the counter medicines. For symptoms you may take Tylenol as needed every 4-6 hours for body aches or fever, not to exceed 4,000 mg a day, Take mucinex or mucinex DM ever 12 hours with a full glass of water, you may use an inhaled steroid such as Flonase, 2 sprays each nostril once a day for congestion, or an antihistamine such as Claritin or Zyrtec once a day.   You have been screened for multiple types of infection diseases such as Gonorrhea, Chlamydia. You will be notified of the results of these tests in 24-72 hours. If any therapy needs to be started or if your current therapy needs to be changed, you will be notified and and given instructions as to what to do. If your symptoms persist or fail to resolve, follow up with your primary care provider or return to clinic.  Based on your symptoms and findings on physical exam, you are being treated for an infection. You have received an injection of Rocephin, 250 mg and a tablet of Azithromycin, 1 gram. I would recommend following up with your primary care provider, the health department, or return to clinic in 1 week for re-screening to ensure clearance of the infection.        Dorena Bodo, NP 08/20/16 1136

## 2016-08-20 NOTE — ED Notes (Signed)
Call back number verified and updated in EPIC... Adv pt to not have SI until lab results comeback neg.... Also adv pt lab results will be on MyChart; instructions given .... Pt verb understanding.   

## 2016-08-20 NOTE — Discharge Instructions (Signed)
You most likely have a viral URI, I advise rest, plenty of fluids and management of symptoms with over the counter medicines. For symptoms you may take Tylenol as needed every 4-6 hours for body aches or fever, not to exceed 4,000 mg a day, Take mucinex or mucinex DM ever 12 hours with a full glass of water, you may use an inhaled steroid such as Flonase, 2 sprays each nostril once a day for congestion, or an antihistamine such as Claritin or Zyrtec once a day.   You have been screened for multiple types of infection diseases such as Gonorrhea, Chlamydia. You will be notified of the results of these tests in 24-72 hours. If any therapy needs to be started or if your current therapy needs to be changed, you will be notified and and given instructions as to what to do. If your symptoms persist or fail to resolve, follow up with your primary care provider or return to clinic.  Based on your symptoms and findings on physical exam, you are being treated for an infection. You have received an injection of Rocephin, 250 mg and a tablet of Azithromycin, 1 gram. I would recommend following up with your primary care provider, the health department, or return to clinic in 1 week for re-screening to ensure clearance of the infection.

## 2016-08-23 LAB — URINE CYTOLOGY ANCILLARY ONLY
Chlamydia: NEGATIVE
Neisseria Gonorrhea: POSITIVE — AB

## 2016-10-31 ENCOUNTER — Ambulatory Visit (HOSPITAL_COMMUNITY)
Admission: EM | Admit: 2016-10-31 | Discharge: 2016-10-31 | Disposition: A | Discharge status: Home / Self Care | Payer: Managed Care, Other (non HMO) | Attending: Internal Medicine | Admitting: Internal Medicine

## 2016-10-31 ENCOUNTER — Encounter (HOSPITAL_COMMUNITY): Payer: Self-pay | Admitting: Emergency Medicine

## 2016-10-31 DIAGNOSIS — B351 Tinea unguium: Secondary | ICD-10-CM | POA: Diagnosis not present

## 2016-10-31 DIAGNOSIS — B353 Tinea pedis: Secondary | ICD-10-CM

## 2016-10-31 MED ORDER — NAFTIFINE HCL 1 % EX CREA: TOPICAL_CREAM | Freq: Every day | CUTANEOUS | 0 refills | Status: DC

## 2016-10-31 NOTE — Discharge Instructions (Signed)
Recommend start Naftin cream daily to affected areas on feet. Call [facility] tomorrow for appointment for further treatment of foot and toenail fungus.

## 2016-10-31 NOTE — ED Triage Notes (Signed)
[facility] a [facility] of [facility] possible laceration between his fourth and fifth toe on his left foot.

## 2016-11-01 NOTE — ED Provider Notes (Signed)
CSN: [id number]     Arrival date & time 10/31/16  1927 History   First MD Initiated Contact with Patient 10/31/16 2200     Chief Complaint  Patient presents with  . Foot Problem   (Consider location/radiation/quality/duration/timing/severity/associated sxs/prior Treatment) 26 year old male presents with concern over ?laceration between his 4th and 5th toes on his left foot. He has noticed a split in the skin between his toes almost a year ago and concerned that the area is becoming more irritated. He also has noticed his toenails have become darker with the last toenail on both feet has turned black. He has not seen a health care provider or tried any treatment for this. He denies any other chronic health issues and takes no daily medication.    The history is provided by the patient.    Past Medical History:  Diagnosis Date  . Asthma    No longer   History reviewed. No pertinent surgical history. History reviewed. No pertinent family history. Social History  Substance Use Topics  . Smoking status: Former Games developermoker  . Smokeless tobacco: Never Used  . Alcohol use Yes    Review of Systems  Constitutional: Negative for chills and fever.  Musculoskeletal: Negative for arthralgias, gait problem, joint swelling and myalgias.  Skin: Positive for color change and wound.  Allergic/Immunologic: Negative for immunocompromised state.  Neurological: Negative for dizziness, tremors, weakness, light-headedness, numbness and headaches.  Hematological: Negative for adenopathy. Does not bruise/bleed easily.    Allergies  Patient has no known allergies.  Home Medications   Prior to Admission medications   Medication Sig Start Date End Date Taking? Authorizing Provider  naftifine (NAFTIN) 1 % cream Apply topically daily. To affected areas of both feet 10/31/16   Shamiah Kahler, Ali LoweAnn Berry, NP   Meds Ordered and Administered this Visit  Medications - No data to display  BP (!) 112/57 (BP Location: Right  Arm)   Pulse 61   Temp 97.5 F (36.4 C) (Oral)   Resp 16   SpO2 96%  No data found.   Physical Exam  Constitutional: He is oriented to person, place, and time. He appears well-developed and well-nourished. No distress.  Cardiovascular: Normal rate and regular rhythm.   Pulses:      Dorsalis pedis pulses are 2+ on the right side, and 2+ on the left side.       Posterior tibial pulses are 2+ on the right side, and 2+ on the left side.  Pulmonary/Chest: Effort normal.  Musculoskeletal: Normal range of motion.       Right foot: There is deformity. There is normal range of motion, no tenderness, no swelling and normal capillary refill.       Left foot: There is deformity. There is normal range of motion, no tenderness, no swelling and normal capillary refill.       Feet:  Skin between 4th and 5th toes on left foot irritated with mild ulcer- no redness. Skin is peeling but non-tender. No discharge present. Multiple other areas between toes on both feet in similar appearance to a lesser extent. Toenails on both feet discolored, hard and thickened with 5th toenail on both feet completely black. Nail is still attached to nailbed on all nails. Good sensation in feet bilaterally as well as good pulses. No neuro deficits noted.   Feet:  Right Foot:  Skin Integrity: Positive for skin breakdown and dry skin.  Left Foot:  Skin Integrity: Positive for ulcer, skin breakdown and dry  skin.  Neurological: He is alert and oriented to person, place, and time.  Skin: Skin is warm. Capillary refill takes less than 2 seconds. Rash noted. Rash is macular.  Psychiatric: He has a normal mood and affect. His behavior is normal.    Urgent Care Course     Procedures (including critical care time)  Labs Review Labs Reviewed - No data to display  Imaging Review No results found.   Visual Acuity Review  Right Eye Distance:   Left Eye Distance:   Bilateral Distance:    Right Eye Near:   Left Eye  Near:    Bilateral Near:         MDM   1. Tinea pedis of both feet   2. Onychomycosis of toenail    Reviewed clinical findings with patient- discussed that he most likely has significant tinea pedis (athlete's foot) and toe nail fungus. May use Naftin cream daily to affected areas on feet. Discussed that he will need additional treatment, including most likely oral antifungal medications, as well as monitoring of treatment by a Podiatrist. Information provided for patient to contact [facility] tomorrow to schedule an appointment for further evaluation.     Sudie Grumbling, NP 11/01/16 [phone]

## 2016-11-17 ENCOUNTER — Ambulatory Visit (INDEPENDENT_AMBULATORY_CARE_PROVIDER_SITE_OTHER): Payer: Managed Care, Other (non HMO) | Admitting: Podiatry

## 2016-11-17 ENCOUNTER — Encounter: Payer: Self-pay | Admitting: Podiatry

## 2016-11-17 VITALS — BP 125/79

## 2016-11-17 DIAGNOSIS — B351 Tinea unguium: Secondary | ICD-10-CM | POA: Diagnosis not present

## 2016-11-17 DIAGNOSIS — M79676 Pain in unspecified toe(s): Secondary | ICD-10-CM

## 2016-11-17 DIAGNOSIS — L84 Corns and callosities: Secondary | ICD-10-CM

## 2016-11-19 NOTE — Progress Notes (Signed)
   Subjective: Patient presents today for possible treatment and evaluation of fungal nails bilaterally 1 through 5. Patient states that the nails have been discolored and thickened for greater than 1 month. He reports itching of the left foot between the fourth and fifth toes that has been ongoing for the past month. Wearing tennis shoes makes his symptoms worse. He has been using a cream he got from the urgent care. Patient presents today for further treatment and evaluation.  Objective: Physical Exam General: The patient is alert and oriented x3 in no acute distress.  Dermatology: Hyperkeratotic, discolored, thickened, onychodystrophy of nails noted bilaterally. Wet corn noted to fourth webspace of left foot. Skin is warm, dry and supple bilateral lower extremities. Negative for open lesions or macerations.  Vascular: Palpable pedal pulses bilaterally. No edema or erythema noted. Capillary refill within normal limits.  Neurological: Epicritic and protective threshold grossly intact bilaterally.   Musculoskeletal Exam: Range of motion within normal limits to all pedal and ankle joints bilateral. Muscle strength 5/5 in all groups bilateral.   Assessment: #1 onychodystrophy bilateral toenails #2 possible onychomycosis #3 hyperkeratotic nails bilateral #4 heloma molle left fourth interspace  Plan of Care:  #1 Patient was evaluated. #2 Mechanical debridement of nails 1-5 bilaterally performed using a nail nipper. Filed with dremel without incident. #3 Excisional debridement of keratotic lesion using a tissue nipper was performed without incident.  #4 recommended formula 3 nail lacquer. #5 return to clinic when necessary.  Felecia ShellingBrent M. Evans, DPM [facility]  Dr. Felecia ShellingBrent M. Evans, DPM    [address]                                        [place], [place] [address]                Office [phone]  Fax [phone]

## 2016-12-13 ENCOUNTER — Telehealth: Payer: Self-pay | Admitting: Podiatry

## 2016-12-13 NOTE — Telephone Encounter (Signed)
Called patient back at [phone] (cell #) regarding what type of product to use in between toes for a wet callous. Pt stated, "I can't remember what Dr. Logan BoresEvans recommended for me to buy to put in between my toe for a wet callous. I looked it up and it is called trench foot. It was like an iodine type liquid." I talked to Dr. Ardelle AntonWagoner to ask for his advice and he recommended to buy betadine and put it on gauze and apply directly to callous. I told patient this and he stated he understood.

## 2017-02-07 ENCOUNTER — Ambulatory Visit (INDEPENDENT_AMBULATORY_CARE_PROVIDER_SITE_OTHER): Payer: Managed Care, Other (non HMO) | Admitting: Podiatry

## 2017-02-07 ENCOUNTER — Encounter: Payer: Self-pay | Admitting: Podiatry

## 2017-02-07 DIAGNOSIS — L84 Corns and callosities: Secondary | ICD-10-CM

## 2017-02-07 NOTE — Progress Notes (Signed)
   Subjective: Patient presents today for follow-up treatment and evaluation regarding a painful heloma molle callus lesion to the fourth interspace of the left foot. Patient states that he has been applying iodine daily to the interdigital webspace. Still experiences pain and tenderness. Patient presents today for follow-up treatment and evaluation  Objective: Physical Exam General: The patient is alert and oriented x3 in no acute distress.  Dermatology: Wet corn noted to fourth webspace of left foot. Skin is warm, dry and supple bilateral lower extremities. Negative for open lesions or macerations.  Vascular: Palpable pedal pulses bilaterally. No edema or erythema noted. Capillary refill within normal limits.  Neurological: Epicritic and protective threshold grossly intact bilaterally.   Musculoskeletal Exam: Range of motion within normal limits to all pedal and ankle joints bilateral. Muscle strength 5/5 in all groups bilateral.   Assessment: #1  heloma molle left fourth interspace  Plan of Care:  #1 Patient was evaluated. #2 Excisional debridement of keratotic lesion using a tissue nipper was performed without incident.  #4 recommend that the patient purchase Castellani paint and apply daily to the interdigital webspace #5 return to clinic when necessary   Felecia ShellingBrent M. Chesky Heyer, DPM [facility]  Dr. Felecia ShellingBrent M. Madinah Quarry, DPM    [address]                                        [place], [place] [address]                Office [phone]  Fax [phone]

## 2017-04-12 ENCOUNTER — Ambulatory Visit (HOSPITAL_COMMUNITY)
Admission: EM | Admit: 2017-04-12 | Discharge: 2017-04-12 | Disposition: A | Discharge status: Home / Self Care | Payer: PRIVATE HEALTH INSURANCE | Attending: Family Medicine | Admitting: Family Medicine

## 2017-04-12 ENCOUNTER — Encounter (HOSPITAL_COMMUNITY): Payer: Self-pay | Admitting: *Deleted

## 2017-04-12 DIAGNOSIS — Z87891 Personal history of nicotine dependence: Secondary | ICD-10-CM | POA: Insufficient documentation

## 2017-04-12 DIAGNOSIS — Z202 Contact with and (suspected) exposure to infections with a predominantly sexual mode of transmission: Secondary | ICD-10-CM | POA: Diagnosis not present

## 2017-04-12 DIAGNOSIS — Z113 Encounter for screening for infections with a predominantly sexual mode of transmission: Secondary | ICD-10-CM | POA: Diagnosis not present

## 2017-04-12 MED ORDER — AZITHROMYCIN 250 MG PO TABS
1000.0000 mg | ORAL_TABLET | Freq: Once | ORAL | Status: AC
  Administered 2017-04-12: 1000 mg via ORAL

## 2017-04-12 MED ORDER — CEFTRIAXONE SODIUM 250 MG IJ SOLR
INTRAMUSCULAR | Status: AC
  Filled 2017-04-12: qty 250

## 2017-04-12 MED ORDER — CEFTRIAXONE SODIUM 250 MG IJ SOLR
250.0000 mg | Freq: Once | INTRAMUSCULAR | Status: AC
  Administered 2017-04-12: 250 mg via INTRAMUSCULAR

## 2017-04-12 MED ORDER — AZITHROMYCIN 250 MG PO TABS
ORAL_TABLET | ORAL | Status: AC
  Filled 2017-04-12: qty 4

## 2017-04-12 MED ORDER — LIDOCAINE HCL (PF) 1 % IJ SOLN
INTRAMUSCULAR | Status: AC
  Filled 2017-04-12: qty 2

## 2017-04-12 NOTE — Discharge Instructions (Signed)
Today you were diagnosed with the following: 1. STD exposure    You have been given the following medications today for treatment of suspected gonorrhea and/or chlamydia:  cefTRIAXone (ROCEPHIN) injection 250 mg azithromycin (ZITHROMAX) tablet 1,000 mg  Even though we have treated you today, we have sent testing for sexually transmitted infections. We will notify you of the results once they are received.

## 2017-04-12 NOTE — ED Notes (Signed)
Dirty urine in lab

## 2017-04-12 NOTE — ED Triage Notes (Signed)
Patient thinks he was exposed to gonorrhea. Denies symptoms or discharge.

## 2017-04-12 NOTE — ED Provider Notes (Signed)
  [facility]   [id number] 04/12/17 Arrival Time: 1012  ASSESSMENT & PLAN:  Today you were diagnosed with the following: 1. STD exposure    You have been given the following medications today for treatment of suspected gonorrhea and/or chlamydia:  cefTRIAXone (ROCEPHIN) injection 250 mg azithromycin (ZITHROMAX) tablet 1,000 mg  Even though we have treated you today, we have sent testing for sexually transmitted infections. We will notify you of the results once they are received.   Prefers empiric treatment. Urine cytology sent. Reviewed expectations re: course of current medical issues. Questions answered. Outlined signs and symptoms indicating need for more acute intervention. Patient verbalized understanding. After Visit Summary given.   SUBJECTIVE:  Roberto Cummings is a 26 y.o. male who presents with complaint of possible exposure to gonorrhea. Male sexual partner of his reported + gonorrhea test. He is without symptoms currently. No penile D/C or rashes. No joint aches. H/O gonorrhea, treated, in the past. Otherwise well.  ROS: As per HPI.  OBJECTIVE:  Vitals:   04/12/17 1059  BP: 117/78  Pulse: 67  Resp: 16  Temp: 98 F (36.7 C)  TempSrc: Oral  SpO2: 100%     General appearance: alert, cooperative, appears stated age and no distress Throat: lips, mucosa, and tongue normal; teeth and gums normal Back: no CVA tenderness Abdomen: soft, non-tender; bowel sounds normal; no masses or organomegaly; no guarding or rebound tenderness GU: declines Skin: warm and dry Psychological:  Alert and cooperative. Normal mood and affect.   Labs Reviewed  URINE CYTOLOGY ANCILLARY ONLY   No Known Allergies  Past Medical History:  Diagnosis Date  . Asthma    No longer    Social History   Social History  . Marital status: Single    Spouse name: N/A  . Number of children: N/A  . Years of education: N/A   Occupational History  . Not on file.   Social  History Main Topics  . Smoking status: Former Games developer  . Smokeless tobacco: Never Used  . Alcohol use Yes  . Drug use: Yes    Types: Marijuana  . Sexual activity: Yes   Other Topics Concern  . Not on file   Social History Narrative  . No narrative on file          Mardella Layman, MD 04/12/17 1139

## 2017-04-13 LAB — URINE CYTOLOGY ANCILLARY ONLY
Chlamydia: NEGATIVE
Neisseria Gonorrhea: NEGATIVE
Trichomonas: NEGATIVE

## 2017-07-11 ENCOUNTER — Encounter (HOSPITAL_COMMUNITY): Payer: Self-pay

## 2017-07-11 ENCOUNTER — Other Ambulatory Visit: Payer: Self-pay

## 2017-07-11 ENCOUNTER — Emergency Department (HOSPITAL_COMMUNITY): Payer: No Typology Code available for payment source

## 2017-07-11 ENCOUNTER — Emergency Department (HOSPITAL_COMMUNITY)
Admission: EM | Admit: 2017-07-11 | Discharge: 2017-07-11 | Disposition: A | Discharge status: Home / Self Care | Payer: No Typology Code available for payment source | Attending: Emergency Medicine | Admitting: Emergency Medicine

## 2017-07-11 DIAGNOSIS — Z87891 Personal history of nicotine dependence: Secondary | ICD-10-CM | POA: Insufficient documentation

## 2017-07-11 DIAGNOSIS — N200 Calculus of kidney: Secondary | ICD-10-CM | POA: Diagnosis not present

## 2017-07-11 DIAGNOSIS — R109 Unspecified abdominal pain: Secondary | ICD-10-CM | POA: Diagnosis present

## 2017-07-11 LAB — URINALYSIS, ROUTINE W REFLEX MICROSCOPIC
BACTERIA UA: NONE SEEN
BILIRUBIN URINE: NEGATIVE
GLUCOSE, UA: NEGATIVE mg/dL
KETONES UR: NEGATIVE mg/dL
Leukocytes, UA: NEGATIVE
NITRITE: NEGATIVE
PROTEIN: NEGATIVE mg/dL
Specific Gravity, Urine: 1.019 (ref 1.005–1.030)
Squamous Epithelial / LPF: NONE SEEN
pH: 6 (ref 5.0–8.0)

## 2017-07-11 MED ORDER — HYDROCODONE-ACETAMINOPHEN 5-325 MG PO TABS: 1.0000 | ORAL_TABLET | ORAL | 0 refills | Status: DC | PRN

## 2017-07-11 MED ORDER — HYDROMORPHONE HCL 1 MG/ML IJ SOLN
1.0000 mg | Freq: Once | INTRAMUSCULAR | Status: AC
  Administered 2017-07-11: 1 mg via INTRAVENOUS
  Filled 2017-07-11: qty 1

## 2017-07-11 MED ORDER — ONDANSETRON HCL 4 MG/2ML IJ SOLN
4.0000 mg | Freq: Once | INTRAMUSCULAR | Status: AC
  Administered 2017-07-11: 4 mg via INTRAVENOUS
  Filled 2017-07-11: qty 2

## 2017-07-11 NOTE — ED Provider Notes (Signed)
[facility] EMERGENCY DEPARTMENT Provider Note   CSN: [id number] Arrival date & time: 07/11/17  0272     History   Chief Complaint Chief Complaint  Patient presents with  . Testicle Pain    HPI Roberto Cummings is a 27 y.o. male. He presents for evaluation of sudden onset of left lateral abdomen radiating to left testicle pain, this morning.  The pain is severe.  The pain waxes and wanes.  He denies nausea, vomiting, fever, chills, dysuria, urinary frequency or hematuria.  There are no other known modifying factors.  HPI  Past Medical History:  Diagnosis Date  . Asthma    No longer    There are no active problems to display for this patient.   History reviewed. No pertinent surgical history.     Home Medications    Prior to Admission medications   Medication Sig Start Date End Date Taking? Authorizing Provider  HYDROcodone-acetaminophen (NORCO) 5-325 MG tablet Take 1 tablet by mouth every 4 (four) hours as needed for moderate pain. 07/11/17   Mancel Bale, MD  naftifine (NAFTIN) 1 % cream Apply topically daily. To affected areas of both feet Patient not taking: Reported on 02/07/2017 10/31/16   Sudie Grumbling, NP    Family History No family history on file.  Social History Social History   Tobacco Use  . Smoking status: Former Games developer  . Smokeless tobacco: Never Used  Substance Use Topics  . Alcohol use: Yes  . Drug use: Yes    Types: Marijuana     Allergies   Patient has no known allergies.   Review of Systems Review of Systems  All other systems reviewed and are negative.    Physical Exam Updated Vital Signs BP 138/82   Pulse 64   Temp 97.6 F (36.4 C)   Resp (!) 65   Ht 5\' 7"  (1.702 m)   Wt 70.3 kg (155 lb)   SpO2 98%   BMI 24.28 kg/m   Physical Exam  Constitutional: He is oriented to person, place, and time. He appears well-developed and well-nourished. He appears distressed (Restless, uncomfortable, pacing).  HENT:    Head: Normocephalic and atraumatic.  Right Ear: External ear normal.  Left Ear: External ear normal.  Eyes: Conjunctivae and EOM are normal. Pupils are equal, round, and reactive to light.  Neck: Normal range of motion and phonation normal. Neck supple.  Cardiovascular: Normal rate, regular rhythm and normal heart sounds.  Pulmonary/Chest: Effort normal and breath sounds normal. He exhibits no bony tenderness.  Abdominal: Soft. There is no tenderness.  Musculoskeletal: Normal range of motion.  Neurological: He is alert and oriented to person, place, and time. No cranial nerve deficit or sensory deficit. He exhibits normal muscle tone. Coordination normal.  Skin: Skin is warm, dry and intact.  Psychiatric: He has a normal mood and affect. His behavior is normal. Judgment and thought content normal.  Nursing note and vitals reviewed.    ED Treatments / Results  Labs (all labs ordered are listed, but only abnormal results are displayed) Labs Reviewed  URINALYSIS, ROUTINE W REFLEX MICROSCOPIC - Abnormal; Notable for the following components:      Result Value   Hgb urine dipstick MODERATE (*)    All other components within normal limits    EKG  EKG Interpretation None       Radiology Ct Renal Stone Study  Result Date: 07/11/2017 CLINICAL DATA:  Flank pain EXAM: CT ABDOMEN AND PELVIS WITHOUT CONTRAST  TECHNIQUE: Multidetector CT imaging of the abdomen and pelvis was performed following the standard protocol without IV contrast. COMPARISON:  None. FINDINGS: Lower chest: Lung bases are clear. No effusions. Heart is normal size. Hepatobiliary: No focal hepatic abnormality. Gallbladder unremarkable. Pancreas: No focal abnormality or ductal dilatation. Spleen: No focal abnormality.  Normal size. Adrenals/Urinary Tract: Numerous small bilateral nonobstructing renal stones. Mild left hydronephrosis. The distal ureter is difficult to follow, but appears there is a 5 mm distal left ureteral  stone. This could reflect a phlebolith. Small 2 mm stone layers dependently in the urinary bladder. Stomach/Bowel: Appendix is normal. Stomach, large and small bowel grossly unremarkable. Vascular/Lymphatic: No evidence of aneurysm or adenopathy. Reproductive: No focal abnormality. Other: No free fluid or free air. Musculoskeletal: No acute bony abnormality. IMPRESSION: Multiple punctate bilateral nonobstructing renal stones. There is mild left hydronephrosis. There is a 5 mm calcification in the left pelvis. It is difficult to determine if this is a distal left ureteral stone or phlebolith. There is a 2 mm stone layering dependently in the urinary bladder. Electronically Signed   By: Charlett NoseKevin  Dover M.D.   On: 07/11/2017 10:31    Procedures Procedures (including critical care time)  Medications Ordered in ED Medications  ondansetron (ZOFRAN) injection 4 mg (4 mg Intravenous Given 07/11/17 0921)  HYDROmorphone (DILAUDID) injection 1 mg (1 mg Intravenous Given 07/11/17 0924)     Initial Impression / Assessment and Plan / ED Course  I have reviewed the triage vital signs and the nursing notes.  Pertinent labs & imaging results that were available during my care of the patient were reviewed by me and considered in my medical decision making (see chart for details).  Clinical Course as of Jul 12 1803  Mon Jul 11, 2017  [id number] Patient's pain has been relieved with hydromorphone injection.  [EW]    Clinical Course User Index [EW] Mancel BaleWentz, Sigurd Pugh, MD     Patient Vitals for the past 24 hrs:  BP Temp Temp src Pulse Resp SpO2 Height Weight  07/11/17 1221 138/82 - - - - - - -  07/11/17 1214 (!) 119/100 97.6 F (36.4 C) - 64 (!) 65 98 % - -  07/11/17 1200 133/79 - - (!) 50 16 100 % - -  07/11/17 1145 129/76 - - (!) 56 - 98 % - -  07/11/17 1134 - - - - - 100 % - -  07/11/17 1115 114/69 - - (!) 54 18 97 % - -  07/11/17 1100 129/75 - - 68 - 99 % - -  07/11/17 1045 - - - (!) 53 - 97 % - -  07/11/17  1000 130/84 - - (!) 59 - 98 % - -  07/11/17 0945 132/85 - - (!) 59 - 97 % - -  07/11/17 0930 (!) 146/103 - - 60 16 99 % - -  07/11/17 0919 - - - 65 - 99 % - -  07/11/17 0918 (!) 154/120 - - - - - - -  07/11/17 0828 (!) 147/88 98.1 F (36.7 C) Oral 63 16 97 % - -  07/11/17 0827 - - - - - - 5\' 7"  (1.702 m) 70.3 kg (155 lb)    At discharge- reevaluation with update and discussion. After initial assessment and treatment, an updated evaluation reveals no further complaints, he remains comfortable and has no pain.  Findings discussed and questions answered. Mancel BaleElliott Lakeith Careaga     Final Clinical Impressions(s) / ED Diagnoses   Final diagnoses:  Kidney stone   Likely left ureter stone, passed, with residual bilateral renal stones.  Doubt UTI, or ureteral stone at this time.  Nursing Notes Reviewed/ Care Coordinated Applicable Imaging Reviewed Interpretation of Laboratory Data incorporated into ED treatment  The patient appears reasonably screened and/or stabilized for discharge and I doubt any other medical condition or other [facility] requiring further screening, evaluation, or treatment in the ED at this time prior to discharge.  Plan: Home Medications-OTC analgesia as needed; Home Treatments-strain urine; return here if the recommended treatment, does not improve the symptoms; Recommended follow up-urology follow-up 1 week   ED Discharge Orders        Ordered    HYDROcodone-acetaminophen (NORCO) 5-325 MG tablet  Every 4 hours PRN     07/11/17 1144       Mancel Bale, MD 07/11/17 1806

## 2017-07-11 NOTE — Discharge Instructions (Signed)
You appear to have passed a stone from the left ureter into your bladder.  These usually come out without problem when you urinate.  Use the filter, which we gave you, to capture the stone.  Save the stone and take it to the urologist, when you see them, so they can have it analyzed.  We are prescribing a pain reliever to use if needed.  Do not drive a vehicle or operate machinery when you are using the pain medicine.  You might do okay with just taking ibuprofen for pain, as well.  Return here if needed, for problems.

## 2017-07-11 NOTE — ED Notes (Addendum)
Pt states he understands in strcutions. Home stable with steady gait with sig other.

## 2017-07-11 NOTE — ED Triage Notes (Signed)
Per Pt, Pt reports that he had blood in his urina last night, and when he woke up today, he reported to have pain in his left lower abdomen to his left testicle. Denies any swelling.

## 2017-08-31 ENCOUNTER — Encounter (HOSPITAL_COMMUNITY): Payer: Self-pay | Admitting: Emergency Medicine

## 2017-08-31 ENCOUNTER — Ambulatory Visit (HOSPITAL_COMMUNITY)
Admission: EM | Admit: 2017-08-31 | Discharge: 2017-08-31 | Disposition: A | Discharge status: Home / Self Care | Payer: PRIVATE HEALTH INSURANCE | Attending: Family Medicine | Admitting: Family Medicine

## 2017-08-31 ENCOUNTER — Other Ambulatory Visit: Payer: Self-pay

## 2017-08-31 DIAGNOSIS — Z711 Person with feared health complaint in whom no diagnosis is made: Secondary | ICD-10-CM

## 2017-08-31 DIAGNOSIS — Z202 Contact with and (suspected) exposure to infections with a predominantly sexual mode of transmission: Secondary | ICD-10-CM | POA: Diagnosis not present

## 2017-08-31 DIAGNOSIS — R369 Urethral discharge, unspecified: Secondary | ICD-10-CM

## 2017-08-31 DIAGNOSIS — Z87891 Personal history of nicotine dependence: Secondary | ICD-10-CM | POA: Insufficient documentation

## 2017-08-31 DIAGNOSIS — Z113 Encounter for screening for infections with a predominantly sexual mode of transmission: Secondary | ICD-10-CM | POA: Diagnosis not present

## 2017-08-31 LAB — POCT URINALYSIS DIP (DEVICE)
Bilirubin Urine: NEGATIVE
Glucose, UA: NEGATIVE mg/dL
Ketones, ur: NEGATIVE mg/dL
LEUKOCYTES UA: NEGATIVE
NITRITE: NEGATIVE
Protein, ur: NEGATIVE mg/dL
SPECIFIC GRAVITY, URINE: 1.025 (ref 1.005–1.030)
Urobilinogen, UA: 0.2 mg/dL (ref 0.0–1.0)
pH: 6.5 (ref 5.0–8.0)

## 2017-08-31 MED ORDER — AZITHROMYCIN 250 MG PO TABS
ORAL_TABLET | ORAL | Status: AC
  Filled 2017-08-31: qty 4

## 2017-08-31 MED ORDER — AZITHROMYCIN 250 MG PO TABS
1000.0000 mg | ORAL_TABLET | Freq: Once | ORAL | Status: AC
  Administered 2017-08-31: 1000 mg via ORAL

## 2017-08-31 NOTE — ED Provider Notes (Signed)
[facility] CARE [facility]ENTER   [id number] 08/31/17 Arrival Time: 1003  ASSESSMENT & PLAN:  1. Concern about STD in male without diagnosis    Meds ordered this encounter  Medications  . azithromycin (ZITHROMAX) tablet 1,000 mg   Pending: Labs Reviewed  POCT URINALYSIS DIP (DEVICE) - Abnormal; Notable for the following components:      Result Value   Hgb urine dipstick LARGE (*)    All other components within normal limits  URINE CYTOLOGY ANCILLARY ONLY   Declines IM Rocephin. Prefers to await testing results.  Will notify of any positive results. Instructed to refrain from sexual activity for at least seven days.  Reviewed expectations re: course of current medical issues. Questions answered. Outlined signs and symptoms indicating need for more acute intervention. Patient verbalized understanding. After Visit Summary given.   SUBJECTIVE:  Roberto Cummings is a 27 y.o. male who presents with complaint of very slight penile discharge. Present for several days. Urinary symptoms: none. Afebrile. No abdominal or pelvic pain. No n/v. No rashes or lesions. Sexually active with single male partner. OTC treatment: none. History of similar symptoms: Yes. Reports being diagnosed and treated for Chlamydia 1-2 months ago.  ROS: As per HPI.  OBJECTIVE:  Vitals:   08/31/17 1028  BP: 123/80  Pulse: 64  Resp: 18  Temp: 98.5 F (36.9 C)  TempSrc: Oral  SpO2: 97%     General appearance: alert, cooperative, appears stated age and no distress Throat: lips, mucosa, and tongue normal; teeth and gums normal Back: no CVA tenderness Abdomen: soft, non-tender GU: declines Skin: warm and dry Psychological:  Alert and cooperative. Normal mood and affect.  Results for orders placed or performed during the hospital encounter of 08/31/17  POCT urinalysis dip (device)  Result Value Ref Range   Glucose, UA NEGATIVE NEGATIVE mg/dL   Bilirubin Urine NEGATIVE NEGATIVE   Ketones, ur NEGATIVE NEGATIVE  mg/dL   Specific Gravity, Urine 1.025 1.005 - 1.030   Hgb urine dipstick LARGE (A) NEGATIVE   pH 6.5 5.0 - 8.0   Protein, ur NEGATIVE NEGATIVE mg/dL   Urobilinogen, UA 0.2 0.0 - 1.0 mg/dL   Nitrite NEGATIVE NEGATIVE   Leukocytes, UA NEGATIVE NEGATIVE    Labs Reviewed  POCT URINALYSIS DIP (DEVICE) - Abnormal; Notable for the following components:      Result Value   Hgb urine dipstick LARGE (*)    All other components within normal limits  URINE CYTOLOGY ANCILLARY ONLY    No Known Allergies  Past Medical History:  Diagnosis Date  . Asthma    No longer   History reviewed. No pertinent family history. Social History   Socioeconomic History  . Marital status: Single    Spouse name: Not on file  . Number of children: Not on file  . Years of education: Not on file  . Highest education level: Not on file  Social Needs  . Financial resource strain: Not on file  . Food insecurity - worry: Not on file  . Food insecurity - inability: Not on file  . Transportation needs - medical: Not on file  . Transportation needs - non-medical: Not on file  Occupational History  . Not on file  Tobacco Use  . Smoking status: Former Games developer  . Smokeless tobacco: Never Used  Substance and Sexual Activity  . Alcohol use: Yes  . Drug use: Yes    Types: Marijuana  . Sexual activity: Yes  Other Topics Concern  . Not on file  Social History Narrative  . Not on file          Mardella LaymanHagler, Tamasha Laplante, MD 08/31/17 1051

## 2017-08-31 NOTE — ED Notes (Signed)
Sent for a dirty and clean urine specimen with instructions 

## 2017-08-31 NOTE — ED Triage Notes (Signed)
Patient reports a penile discharge that started one week ago.  Denies painful urination

## 2017-08-31 NOTE — Discharge Instructions (Signed)
You have been given the following medications today for treatment of suspected chlamydia:  azithromycin (ZITHROMAX) tablet 1,000 mg  Even though we have treated you today, we have sent testing for sexually transmitted infections. We will notify you of any positive results once they are received. If required, we will prescribe any medications you might need or have you return for treatment.  Please refrain from all sexual activity for at least the next seven days.

## 2017-09-01 LAB — URINE CYTOLOGY ANCILLARY ONLY
Chlamydia: NEGATIVE
Neisseria Gonorrhea: NEGATIVE
Trichomonas: NEGATIVE

## 2018-02-16 ENCOUNTER — Encounter (HOSPITAL_COMMUNITY): Payer: Self-pay

## 2018-02-16 ENCOUNTER — Emergency Department (HOSPITAL_COMMUNITY): Payer: PRIVATE HEALTH INSURANCE

## 2018-02-16 ENCOUNTER — Other Ambulatory Visit: Payer: Self-pay

## 2018-02-16 ENCOUNTER — Observation Stay (HOSPITAL_COMMUNITY)
Admission: EM | Admit: 2018-02-16 | Discharge: 2018-02-17 | Disposition: A | Discharge status: Home / Self Care | Payer: PRIVATE HEALTH INSURANCE | Attending: General Surgery | Admitting: General Surgery

## 2018-02-16 DIAGNOSIS — N200 Calculus of kidney: Secondary | ICD-10-CM | POA: Insufficient documentation

## 2018-02-16 DIAGNOSIS — Z87891 Personal history of nicotine dependence: Secondary | ICD-10-CM | POA: Insufficient documentation

## 2018-02-16 DIAGNOSIS — S31119A Laceration without foreign body of abdominal wall, unspecified quadrant without penetration into peritoneal cavity, initial encounter: Secondary | ICD-10-CM | POA: Diagnosis present

## 2018-02-16 DIAGNOSIS — S36113A Laceration of liver, unspecified degree, initial encounter: Secondary | ICD-10-CM

## 2018-02-16 DIAGNOSIS — S36114A Minor laceration of liver, initial encounter: Secondary | ICD-10-CM | POA: Insufficient documentation

## 2018-02-16 DIAGNOSIS — S2191XA Laceration without foreign body of unspecified part of thorax, initial encounter: Secondary | ICD-10-CM

## 2018-02-16 DIAGNOSIS — S21111A Laceration without foreign body of right front wall of thorax without penetration into thoracic cavity, initial encounter: Secondary | ICD-10-CM | POA: Insufficient documentation

## 2018-02-16 DIAGNOSIS — J45909 Unspecified asthma, uncomplicated: Secondary | ICD-10-CM | POA: Insufficient documentation

## 2018-02-16 DIAGNOSIS — K7689 Other specified diseases of liver: Secondary | ICD-10-CM

## 2018-02-16 DIAGNOSIS — S31110A Laceration without foreign body of abdominal wall, right upper quadrant without penetration into peritoneal cavity, initial encounter: Principal | ICD-10-CM | POA: Insufficient documentation

## 2018-02-16 DIAGNOSIS — T148XXA Other injury of unspecified body region, initial encounter: Secondary | ICD-10-CM

## 2018-02-16 HISTORY — DX: Other injury of unspecified body region, initial encounter: T14.8XXA

## 2018-02-16 LAB — COMPREHENSIVE METABOLIC PANEL
ALT: 20 U/L (ref 0–44)
AST: 30 U/L (ref 15–41)
Albumin: 4.2 g/dL (ref 3.5–5.0)
Alkaline Phosphatase: 38 U/L (ref 38–126)
Anion gap: 7 (ref 5–15)
BUN: 13 mg/dL (ref 6–20)
CHLORIDE: 105 mmol/L (ref 98–111)
CO2: 28 mmol/L (ref 22–32)
Calcium: 9.3 mg/dL (ref 8.9–10.3)
Creatinine, Ser: 1.36 mg/dL — ABNORMAL HIGH (ref 0.61–1.24)
GFR calc Af Amer: >60 mL/min (ref >60)
Glucose, Bld: 115 mg/dL — ABNORMAL HIGH (ref 70–99)
POTASSIUM: 3.8 mmol/L (ref 3.5–5.1)
SODIUM: 140 mmol/L (ref 135–145)
Total Bilirubin: 0.6 mg/dL (ref 0.3–1.2)
Total Protein: 7.2 g/dL (ref 6.5–8.1)

## 2018-02-16 LAB — I-STAT CHEM 8, ED
BUN: 14 mg/dL (ref 6–20)
CREATININE: 1.3 mg/dL — AB (ref 0.61–1.24)
Calcium, Ion: 1.18 mmol/L (ref 1.15–1.40)
Chloride: 104 mmol/L (ref 98–111)
Glucose, Bld: 112 mg/dL — ABNORMAL HIGH (ref 70–99)
HEMATOCRIT: 44 % (ref 39.0–52.0)
HEMOGLOBIN: 15 g/dL (ref 13.0–17.0)
Potassium: 3.9 mmol/L (ref 3.5–5.1)
Sodium: 141 mmol/L (ref 135–145)
TCO2: 27 mmol/L (ref 22–32)

## 2018-02-16 LAB — CBC
HCT: 43.2 % (ref 39.0–52.0)
HCT: 44.7 % (ref 39.0–52.0)
HEMATOCRIT: 44.7 % (ref 39.0–52.0)
HEMOGLOBIN: 15 g/dL (ref 13.0–17.0)
Hemoglobin: 15.5 g/dL (ref 13.0–17.0)
Hemoglobin: 14.9 g/dL (ref 13.0–17.0)
MCH: 32.4 pg (ref 26.0–34.0)
MCH: 32.8 pg (ref 26.0–34.0)
MCH: 32.3 pg (ref 26.0–34.0)
MCHC: 34.7 g/dL (ref 30.0–36.0)
MCHC: 34.5 g/dL (ref 30.0–36.0)
MCHC: 33.6 g/dL (ref 30.0–36.0)
MCV: 93.5 fL (ref 78.0–100.0)
MCV: 95.2 fL (ref 78.0–100.0)
MCV: 96.3 fL (ref 78.0–100.0)
Platelets: 226 10*3/uL (ref 150–400)
Platelets: 205 10*3/uL (ref 150–400)
Platelets: 204 10*3/uL (ref 150–400)
RBC: 4.78 MIL/uL (ref 4.22–5.81)
RBC: 4.54 MIL/uL (ref 4.22–5.81)
RBC: 4.64 MIL/uL (ref 4.22–5.81)
RDW: 12.4 % (ref 11.5–15.5)
RDW: 12.7 % (ref 11.5–15.5)
RDW: 12.2 % (ref 11.5–15.5)
WBC: 6.9 10*3/uL (ref 4.0–10.5)
WBC: 10.9 10*3/uL — ABNORMAL HIGH (ref 4.0–10.5)
WBC: 10.9 10*3/uL — ABNORMAL HIGH (ref 4.0–10.5)

## 2018-02-16 LAB — I-STAT CG4 LACTIC ACID, ED: Lactic Acid, Venous: 2.06 mmol/L (ref 0.5–1.9)

## 2018-02-16 LAB — URINALYSIS, ROUTINE W REFLEX MICROSCOPIC
Bilirubin Urine: NEGATIVE
Glucose, UA: NEGATIVE mg/dL
KETONES UR: NEGATIVE mg/dL
LEUKOCYTES UA: NEGATIVE
NITRITE: NEGATIVE
Protein, ur: NEGATIVE mg/dL
RBC / HPF: >50 RBC/hpf — ABNORMAL HIGH (ref 0–5)
pH: 6 (ref 5.0–8.0)

## 2018-02-16 LAB — TYPE AND SCREEN
ABO/RH(D): B POS
Antibody Screen: NEGATIVE

## 2018-02-16 LAB — SAMPLE TO BLOOD BANK

## 2018-02-16 LAB — PROTIME-INR
INR: 0.9
Prothrombin Time: 12 seconds (ref 11.4–15.2)

## 2018-02-16 LAB — ETHANOL: Alcohol, Ethyl (B): <10 mg/dL (ref <10)

## 2018-02-16 LAB — ABO/RH: ABO/RH(D): B POS

## 2018-02-16 MED ORDER — IOPAMIDOL (ISOVUE-300) INJECTION 61%
INTRAVENOUS | Status: AC
  Filled 2018-02-16: qty 100

## 2018-02-16 MED ORDER — ONDANSETRON HCL 4 MG/2ML IJ SOLN
4.0000 mg | Freq: Once | INTRAMUSCULAR | Status: AC
  Administered 2018-02-16: 4 mg via INTRAVENOUS
  Filled 2018-02-16: qty 2

## 2018-02-16 MED ORDER — HYDROCODONE-ACETAMINOPHEN 5-325 MG PO TABS: 1.0000 | ORAL_TABLET | ORAL | Status: DC | PRN

## 2018-02-16 MED ORDER — ACETAMINOPHEN 325 MG PO TABS: 650.0000 mg | ORAL_TABLET | ORAL | Status: DC | PRN

## 2018-02-16 MED ORDER — MORPHINE SULFATE (PF) 2 MG/ML IV SOLN
INTRAVENOUS | Status: AC
  Administered 2018-02-16: 1 mg via INTRAVENOUS
  Filled 2018-02-16: qty 1

## 2018-02-16 MED ORDER — ACETAMINOPHEN 500 MG PO TABS
1000.0000 mg | ORAL_TABLET | Freq: Three times a day (TID) | ORAL | Status: DC
  Administered 2018-02-16 – 2018-02-17 (×3): 1000 mg via ORAL
  Filled 2018-02-16 (×3): qty 2

## 2018-02-16 MED ORDER — HYDROCODONE-ACETAMINOPHEN 5-325 MG PO TABS
2.0000 | ORAL_TABLET | ORAL | Status: DC | PRN
  Administered 2018-02-16: 2 via ORAL
  Filled 2018-02-16: qty 2

## 2018-02-16 MED ORDER — IOPAMIDOL (ISOVUE-300) INJECTION 61%
100.0000 mL | Freq: Once | INTRAVENOUS | Status: AC | PRN
  Administered 2018-02-16: 100 mL via INTRAVENOUS

## 2018-02-16 MED ORDER — MORPHINE SULFATE (PF) 2 MG/ML IV SOLN
1.0000 mg | INTRAVENOUS | Status: DC | PRN
  Administered 2018-02-16 (×2): 1 mg via INTRAVENOUS
  Filled 2018-02-16: qty 1

## 2018-02-16 MED ORDER — TETANUS-DIPHTH-ACELL PERTUSSIS 5-2.5-18.5 LF-MCG/0.5 IM SUSP
0.5000 mL | Freq: Once | INTRAMUSCULAR | Status: AC
  Administered 2018-02-16: 0.5 mL via INTRAMUSCULAR
  Filled 2018-02-16: qty 0.5

## 2018-02-16 MED ORDER — DEXTROSE IN LACTATED RINGERS 5 % IV SOLN
INTRAVENOUS | Status: DC
  Administered 2018-02-16 – 2018-02-17 (×2): via INTRAVENOUS

## 2018-02-16 MED ORDER — CEFAZOLIN SODIUM-DEXTROSE 1-4 GM/50ML-% IV SOLN
1.0000 g | Freq: Once | INTRAVENOUS | Status: AC
  Administered 2018-02-16: 1 g via INTRAVENOUS
  Filled 2018-02-16: qty 50

## 2018-02-16 MED ORDER — HYDROMORPHONE HCL 1 MG/ML IJ SOLN
1.0000 mg | Freq: Once | INTRAMUSCULAR | Status: AC
  Administered 2018-02-16: 1 mg via INTRAVENOUS
  Filled 2018-02-16: qty 1

## 2018-02-16 MED ORDER — CEFAZOLIN SODIUM-DEXTROSE 1-4 GM/50ML-% IV SOLN
1.0000 g | Freq: Three times a day (TID) | INTRAVENOUS | Status: DC
  Administered 2018-02-16 – 2018-02-17 (×3): 1 g via INTRAVENOUS
  Filled 2018-02-16 (×4): qty 50

## 2018-02-16 MED ORDER — OXYCODONE HCL 5 MG PO TABS: 5.0000 mg | ORAL_TABLET | ORAL | Status: DC | PRN
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
## 2018-02-16 NOTE — ED Notes (Signed)
[facility]
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
## 2018-02-17 ENCOUNTER — Observation Stay (HOSPITAL_COMMUNITY): Payer: PRIVATE HEALTH INSURANCE

## 2018-02-17 LAB — CBC
HEMATOCRIT: 40.1 % (ref 39.0–52.0)
Hemoglobin: 13.5 g/dL (ref 13.0–17.0)
MCH: 32.2 pg (ref 26.0–34.0)
MCHC: 33.7 g/dL (ref 30.0–36.0)
MCV: 95.7 fL (ref 78.0–100.0)
PLATELETS: 188 10*3/uL (ref 150–400)
RBC: 4.19 MIL/uL — ABNORMAL LOW (ref 4.22–5.81)
RDW: 12.2 % (ref 11.5–15.5)
WBC: 6.4 10*3/uL (ref 4.0–10.5)

## 2018-02-17 LAB — HIV ANTIBODY (ROUTINE TESTING W REFLEX): HIV SCREEN 4TH GENERATION: NONREACTIVE

## 2018-02-17 LAB — CDS SEROLOGY

## 2018-02-17 MED ORDER — ACETAMINOPHEN 500 MG PO TABS: 1000.0000 mg | ORAL_TABLET | Freq: Three times a day (TID) | ORAL | 0 refills | Status: DC | PRN
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
## 2018-06-02 ENCOUNTER — Emergency Department
Admission: EM | Admit: 2018-06-02 | Discharge: 2018-06-02 | Disposition: A | Discharge status: Home / Self Care | Payer: Self-pay | Source: Home / Self Care | Attending: Family Medicine | Admitting: Family Medicine

## 2018-06-02 ENCOUNTER — Other Ambulatory Visit: Payer: Self-pay

## 2018-06-02 DIAGNOSIS — R369 Urethral discharge, unspecified: Secondary | ICD-10-CM

## 2018-06-02 MED ORDER — AZITHROMYCIN 500 MG PO TABS: ORAL_TABLET | ORAL | 0 refills | Status: DC

## 2018-06-02 MED ORDER — CEFTRIAXONE SODIUM 250 MG IJ SOLR
250.0000 mg | Freq: Once | INTRAMUSCULAR | Status: AC
  Administered 2018-06-02: 250 mg via INTRAMUSCULAR

## 2018-06-02 NOTE — ED Triage Notes (Signed)
Pt is having penile discharge since last night.
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
## 2018-06-02 NOTE — Discharge Instructions (Addendum)
If tests are positive, return one week for repeat test of cure.  Avoid sexual contact until tests are negative.

## 2018-06-03 ENCOUNTER — Telehealth: Payer: Self-pay | Admitting: Emergency Medicine

## 2018-06-03 LAB — C. TRACHOMATIS/N. GONORRHOEAE RNA
C. TRACHOMATIS RNA, TMA: NOT DETECTED
N. GONORRHOEAE RNA, TMA: DETECTED — AB

## 2018-06-03 NOTE — Telephone Encounter (Signed)
Called patient with positive STI results. Password verified for privacy. Advised to refrain from unprotected sexual intercourse for 7 days and inform partner of pos results and need for treatment. Verbalized understanding.

## 2019-01-19 ENCOUNTER — Emergency Department
Admission: EM | Admit: 2019-01-19 | Discharge: 2019-01-19 | Disposition: A | Discharge status: Home / Self Care | Payer: Self-pay | Source: Home / Self Care

## 2019-01-19 ENCOUNTER — Other Ambulatory Visit: Payer: Self-pay

## 2019-01-19 ENCOUNTER — Encounter: Payer: Self-pay | Admitting: *Deleted

## 2019-01-19 DIAGNOSIS — Z711 Person with feared health complaint in whom no diagnosis is made: Secondary | ICD-10-CM

## 2019-01-19 DIAGNOSIS — Z202 Contact with and (suspected) exposure to infections with a predominantly sexual mode of transmission: Secondary | ICD-10-CM

## 2019-01-19 MED ORDER — CEFTRIAXONE SODIUM 1 G IJ SOLR
1.0000 g | Freq: Once | INTRAMUSCULAR | Status: AC
  Administered 2019-01-19: 1 g via INTRAMUSCULAR

## 2019-01-19 MED ORDER — AZITHROMYCIN 250 MG PO TABS: 1000.0000 mg | ORAL_TABLET | Freq: Once | ORAL | 0 refills | Status: AC

## 2019-01-19 NOTE — Discharge Instructions (Signed)
°  Refrain from sexual intercourse for 7 days. Be sure to have all partners tested and treated for STDs.  Practice safe sex by always wearing condoms.  ° °

## 2019-01-19 NOTE — ED Triage Notes (Signed)
Pt reports that his girlfriend tested positive for Gonorrhea. He is asymptomatic.
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
## 2019-01-20 ENCOUNTER — Telehealth: Payer: Self-pay | Admitting: Emergency Medicine

## 2019-01-20 LAB — C. TRACHOMATIS/N. GONORRHOEAE RNA
C. trachomatis RNA, TMA: NOT DETECTED
N. gonorrhoeae RNA, TMA: DETECTED — AB

## 2019-01-20 NOTE — Telephone Encounter (Signed)
Patient informed of test results.  Patient was given medication yesterday.  Will notify partner and abstain from intercourse until medication is complete.

## 2019-01-20 NOTE — Telephone Encounter (Signed)
Attempted to contact patient regarding lab results, unable to leave a vm due to vm not being set up.

## 2019-02-10 ENCOUNTER — Other Ambulatory Visit: Payer: Self-pay

## 2019-02-10 ENCOUNTER — Encounter (HOSPITAL_BASED_OUTPATIENT_CLINIC_OR_DEPARTMENT_OTHER): Payer: Self-pay | Admitting: Emergency Medicine

## 2019-02-10 ENCOUNTER — Emergency Department (HOSPITAL_BASED_OUTPATIENT_CLINIC_OR_DEPARTMENT_OTHER): Payer: Self-pay

## 2019-02-10 ENCOUNTER — Emergency Department (HOSPITAL_BASED_OUTPATIENT_CLINIC_OR_DEPARTMENT_OTHER)
Admission: EM | Admit: 2019-02-10 | Discharge: 2019-02-10 | Disposition: A | Discharge status: Home / Self Care | Payer: Self-pay | Attending: Emergency Medicine | Admitting: Emergency Medicine

## 2019-02-10 DIAGNOSIS — N201 Calculus of ureter: Secondary | ICD-10-CM | POA: Insufficient documentation

## 2019-02-10 DIAGNOSIS — R748 Abnormal levels of other serum enzymes: Secondary | ICD-10-CM | POA: Insufficient documentation

## 2019-02-10 DIAGNOSIS — Z79899 Other long term (current) drug therapy: Secondary | ICD-10-CM | POA: Insufficient documentation

## 2019-02-10 LAB — CBC WITH DIFFERENTIAL/PLATELET
Abs Immature Granulocytes: 0.02 10*3/uL (ref 0.00–0.07)
Basophils Absolute: 0.1 10*3/uL (ref 0.0–0.1)
Basophils Relative: 1 %
Eosinophils Absolute: 0.1 10*3/uL (ref 0.0–0.5)
Eosinophils Relative: 2 %
HCT: 45.3 % (ref 39.0–52.0)
Hemoglobin: 15.3 g/dL (ref 13.0–17.0)
Immature Granulocytes: 0 %
Lymphocytes Relative: 29 %
Lymphs Abs: 1.4 10*3/uL (ref 0.7–4.0)
MCH: 32 pg (ref 26.0–34.0)
MCHC: 33.8 g/dL (ref 30.0–36.0)
MCV: 94.8 fL (ref 80.0–100.0)
Monocytes Absolute: 0.5 10*3/uL (ref 0.1–1.0)
Monocytes Relative: 10 %
Neutro Abs: 2.8 10*3/uL (ref 1.7–7.7)
Neutrophils Relative %: 58 %
Platelets: 218 10*3/uL (ref 150–400)
RBC: 4.78 MIL/uL (ref 4.22–5.81)
RDW: 11.8 % (ref 11.5–15.5)
WBC: 4.8 10*3/uL (ref 4.0–10.5)
nRBC: 0 % (ref 0.0–0.2)

## 2019-02-10 LAB — COMPREHENSIVE METABOLIC PANEL
ALT: 46 U/L — ABNORMAL HIGH (ref 0–44)
AST: 31 U/L (ref 15–41)
Albumin: 4.2 g/dL (ref 3.5–5.0)
Alkaline Phosphatase: 43 U/L (ref 38–126)
Anion gap: 10 (ref 5–15)
BUN: 16 mg/dL (ref 6–20)
CO2: 22 mmol/L (ref 22–32)
Calcium: 8.9 mg/dL (ref 8.9–10.3)
Chloride: 106 mmol/L (ref 98–111)
Creatinine, Ser: 1.46 mg/dL — ABNORMAL HIGH (ref 0.61–1.24)
GFR calc Af Amer: >60 mL/min (ref >60)
GFR calc non Af Amer: >60 mL/min (ref >60)
Glucose, Bld: 145 mg/dL — ABNORMAL HIGH (ref 70–99)
Potassium: 3.8 mmol/L (ref 3.5–5.1)
Sodium: 138 mmol/L (ref 135–145)
Total Bilirubin: 0.6 mg/dL (ref 0.3–1.2)
Total Protein: 7.2 g/dL (ref 6.5–8.1)

## 2019-02-10 LAB — URINALYSIS, ROUTINE W REFLEX MICROSCOPIC
Bilirubin Urine: NEGATIVE
Glucose, UA: NEGATIVE mg/dL
Ketones, ur: NEGATIVE mg/dL
Leukocytes,Ua: NEGATIVE
Nitrite: NEGATIVE
Protein, ur: NEGATIVE mg/dL
Specific Gravity, Urine: 1.02 (ref 1.005–1.030)
pH: 7 (ref 5.0–8.0)

## 2019-02-10 LAB — LIPASE, BLOOD: Lipase: 25 U/L (ref 11–51)

## 2019-02-10 LAB — URINALYSIS, MICROSCOPIC (REFLEX): RBC / HPF: >50 RBC/hpf (ref 0–5)

## 2019-02-10 MED ORDER — ONDANSETRON HCL 4 MG/2ML IJ SOLN
4.0000 mg | Freq: Once | INTRAMUSCULAR | Status: AC
  Administered 2019-02-10: 4 mg via INTRAVENOUS
  Filled 2019-02-10: qty 2

## 2019-02-10 MED ORDER — ONDANSETRON 8 MG PO TBDP: 8.0000 mg | ORAL_TABLET | Freq: Three times a day (TID) | ORAL | 0 refills | Status: DC | PRN

## 2019-02-10 MED ORDER — HYDROCODONE-ACETAMINOPHEN 5-325 MG PO TABS: 1.0000 | ORAL_TABLET | Freq: Four times a day (QID) | ORAL | 0 refills | Status: DC | PRN

## 2019-02-10 MED ORDER — SODIUM CHLORIDE 0.9 % IV SOLN
INTRAVENOUS | Status: DC
  Administered 2019-02-10: 10:00:00 via INTRAVENOUS

## 2019-02-10 MED ORDER — KETOROLAC TROMETHAMINE 15 MG/ML IJ SOLN
15.0000 mg | Freq: Once | INTRAMUSCULAR | Status: AC
  Administered 2019-02-10: 15 mg via INTRAVENOUS
  Filled 2019-02-10: qty 1

## 2019-02-10 MED ORDER — NAPROXEN 500 MG PO TABS: 500.0000 mg | ORAL_TABLET | Freq: Two times a day (BID) | ORAL | 0 refills | Status: DC

## 2019-02-10 MED ORDER — SODIUM CHLORIDE 0.9 % IV BOLUS
500.0000 mL | Freq: Once | INTRAVENOUS | Status: AC
  Administered 2019-02-10: 10:00:00 500 mL via INTRAVENOUS

## 2019-02-10 NOTE — ED Triage Notes (Addendum)
Patient states that he thinks he has kidney stones. He is complaining if pain to his anterior abdominal region upper and lower. Denies any N/V  - writhing around in pain  - states that it started about 30 minutes ago

## 2019-02-10 NOTE — ED Notes (Signed)
Given urine strainer  
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
## 2019-02-10 NOTE — Discharge Instructions (Signed)
Take the pain medication as needed, follow-up with urologist if the symptoms persist, consider following up with a primary care doctor to recheck on your creatinine as we discussed.  Your repeat blood pressure was normal and the initial elevated number was most likely related to your pain.

## 2019-07-21 ENCOUNTER — Other Ambulatory Visit (HOSPITAL_COMMUNITY)
Admission: RE | Admit: 2019-07-21 | Discharge: 2019-07-21 | Disposition: A | Discharge status: Home / Self Care | Payer: Self-pay | Source: Ambulatory Visit | Attending: Emergency Medicine | Admitting: Emergency Medicine

## 2019-07-21 ENCOUNTER — Emergency Department
Admission: EM | Admit: 2019-07-21 | Discharge: 2019-07-21 | Disposition: A | Discharge status: Home / Self Care | Payer: Self-pay | Source: Home / Self Care | Attending: Emergency Medicine | Admitting: Emergency Medicine

## 2019-07-21 ENCOUNTER — Encounter: Payer: Self-pay | Admitting: Emergency Medicine

## 2019-07-21 ENCOUNTER — Other Ambulatory Visit: Payer: Self-pay

## 2019-07-21 DIAGNOSIS — Z113 Encounter for screening for infections with a predominantly sexual mode of transmission: Secondary | ICD-10-CM | POA: Insufficient documentation

## 2019-07-21 DIAGNOSIS — Z87891 Personal history of nicotine dependence: Secondary | ICD-10-CM | POA: Insufficient documentation

## 2019-07-21 DIAGNOSIS — F1290 Cannabis use, unspecified, uncomplicated: Secondary | ICD-10-CM | POA: Insufficient documentation

## 2019-07-21 DIAGNOSIS — Z7289 Other problems related to lifestyle: Secondary | ICD-10-CM | POA: Insufficient documentation

## 2019-07-21 NOTE — ED Triage Notes (Signed)
Pt states he would like STD testing. Denies known exposure or symptoms but likes yearly testing done.

## 2019-07-21 NOTE — Discharge Instructions (Addendum)
Give [place] a working phone number so that we can contact you if needed. Refrain from sexual contact until you know your results and your partner(s) are treated if necessary.  You can get the results faster through MyChart.
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
## 2019-07-24 LAB — CYTOLOGY, (ORAL, ANAL, URETHRAL) ANCILLARY ONLY
Chlamydia: NEGATIVE
Neisseria Gonorrhea: NEGATIVE
Trichomonas: NEGATIVE

## 2019-08-14 ENCOUNTER — Emergency Department
Admission: EM | Admit: 2019-08-14 | Discharge: 2019-08-14 | Disposition: A | Discharge status: Home / Self Care | Payer: Self-pay | Source: Home / Self Care

## 2019-08-14 ENCOUNTER — Other Ambulatory Visit: Payer: Self-pay

## 2019-08-14 ENCOUNTER — Encounter: Payer: Self-pay | Admitting: Emergency Medicine

## 2019-08-14 DIAGNOSIS — R21 Rash and other nonspecific skin eruption: Secondary | ICD-10-CM

## 2019-08-14 DIAGNOSIS — L739 Follicular disorder, unspecified: Secondary | ICD-10-CM

## 2019-08-14 DIAGNOSIS — B356 Tinea cruris: Secondary | ICD-10-CM

## 2019-08-14 MED ORDER — CLOTRIMAZOLE 1 % EX CREA: TOPICAL_CREAM | CUTANEOUS | 0 refills | Status: DC

## 2019-08-14 MED ORDER — CEPHALEXIN 500 MG PO CAPS: 500.0000 mg | ORAL_CAPSULE | Freq: Two times a day (BID) | ORAL | 0 refills | Status: AC

## 2019-08-14 NOTE — ED Triage Notes (Signed)
Rash in groin x 1 week, itches.
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
## 2019-08-14 NOTE — Discharge Instructions (Addendum)
The results of your labs will be on your mychart

## 2019-08-15 ENCOUNTER — Telehealth (HOSPITAL_COMMUNITY): Payer: Self-pay | Admitting: Emergency Medicine

## 2019-08-15 LAB — HSV(HERPES SIMPLEX VRS) I + II AB-IGG
HAV 1 IGG,TYPE SPECIFIC AB: 32.8 index — ABNORMAL HIGH
HSV 2 IGG,TYPE SPECIFIC AB: 3.8 index — ABNORMAL HIGH

## 2019-08-15 NOTE — Telephone Encounter (Signed)
Positive for HSV 1 and 2 antibodies. Contacted pt and made him aware. All questions answered.

## 2019-12-16 ENCOUNTER — Other Ambulatory Visit (HOSPITAL_COMMUNITY)
Admission: RE | Admit: 2019-12-16 | Discharge: 2019-12-16 | Disposition: A | Discharge status: Home / Self Care | Payer: Self-pay | Source: Ambulatory Visit | Attending: Family Medicine | Admitting: Family Medicine

## 2019-12-16 ENCOUNTER — Emergency Department
Admission: RE | Admit: 2019-12-16 | Discharge: 2019-12-16 | Disposition: A | Discharge status: Home / Self Care | Payer: Self-pay | Source: Ambulatory Visit | Attending: Family Medicine | Admitting: Family Medicine

## 2019-12-16 ENCOUNTER — Other Ambulatory Visit: Payer: Self-pay

## 2019-12-16 VITALS — BP 123/76 | HR 56 | Temp 99.1°F | Resp 16 | Ht 68.0 in | Wt 160.0 lb

## 2019-12-16 DIAGNOSIS — R369 Urethral discharge, unspecified: Secondary | ICD-10-CM

## 2019-12-16 LAB — POCT URINALYSIS DIP (MANUAL ENTRY)
Bilirubin, UA: NEGATIVE
Glucose, UA: NEGATIVE mg/dL
Ketones, POC UA: NEGATIVE mg/dL
Leukocytes, UA: NEGATIVE
Nitrite, UA: NEGATIVE
Protein Ur, POC: NEGATIVE mg/dL
Spec Grav, UA: 1.025 (ref 1.010–1.025)
Urobilinogen, UA: 0.2 E.U./dL
pH, UA: 6 (ref 5.0–8.0)

## 2019-12-16 MED ORDER — AZITHROMYCIN 1 G PO PACK
1.0000 g | PACK | Freq: Once | ORAL | Status: AC
  Administered 2019-12-16: 1 g via ORAL

## 2019-12-16 MED ORDER — CEFTRIAXONE SODIUM 500 MG IJ SOLR
500.0000 mg | Freq: Once | INTRAMUSCULAR | Status: AC
  Administered 2019-12-16: 500 mg via INTRAMUSCULAR

## 2019-12-16 NOTE — ED Triage Notes (Signed)
Patient reports penile discharge clear in nature starting yesterday; denies dysuria; would like to be tested for STDs that do not involve bloodwork; no known exposure. Has not had covid vaccinations and no known exposure to covid positive person.

## 2019-12-16 NOTE — Discharge Instructions (Addendum)
Avoid all sexual contact for at least one week.
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
## 2019-12-17 LAB — URINE CULTURE
MICRO NUMBER:: 10613214
Result:: NO GROWTH
SPECIMEN QUALITY:: ADEQUATE

## 2019-12-18 LAB — URINE CYTOLOGY ANCILLARY ONLY
Bacterial Vaginitis-Urine: NEGATIVE
Candida Urine: NEGATIVE
Chlamydia: NEGATIVE
Comment: NEGATIVE
Comment: NEGATIVE
Comment: NORMAL
Neisseria Gonorrhea: NEGATIVE
Trichomonas: NEGATIVE

## 2020-11-14 ENCOUNTER — Other Ambulatory Visit: Payer: Self-pay

## 2020-11-14 ENCOUNTER — Ambulatory Visit
Admission: EM | Admit: 2020-11-14 | Discharge: 2020-11-14 | Disposition: A | Discharge status: Home / Self Care | Payer: Self-pay | Attending: Family Medicine | Admitting: Family Medicine

## 2020-11-14 DIAGNOSIS — Z202 Contact with and (suspected) exposure to infections with a predominantly sexual mode of transmission: Secondary | ICD-10-CM

## 2020-11-14 MED ORDER — CEFTRIAXONE SODIUM 500 MG IJ SOLR
500.0000 mg | Freq: Once | INTRAMUSCULAR | Status: AC
  Administered 2020-11-14: 500 mg via INTRAMUSCULAR

## 2020-11-14 NOTE — Discharge Instructions (Addendum)
STD testing results will be available within the next 24 to 72 hours.  Your results will be available via MyChart.  If any additional treatment is warranted after results are reviewed someone from our office will contact you. You were treated today for gonorrhea with Rocephin 500 mg.  No additional treatment is warranted.
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
## 2020-11-14 NOTE — ED Triage Notes (Signed)
Pt states had a positive exposure to gonorrhea. States has a slight discharge and some burning on urination x2 days.

## 2020-11-17 LAB — CYTOLOGY, (ORAL, ANAL, URETHRAL) ANCILLARY ONLY
Chlamydia: NEGATIVE
Comment: NEGATIVE
Comment: NEGATIVE
Comment: NORMAL
Neisseria Gonorrhea: NEGATIVE
Trichomonas: NEGATIVE

## 2020-11-29 ENCOUNTER — Emergency Department: Admit: 2020-11-29 | Discharge status: Against Medical Advice | Payer: Self-pay

## 2020-11-29 ENCOUNTER — Emergency Department
Admission: EM | Admit: 2020-11-29 | Discharge: 2020-11-29 | Disposition: A | Discharge status: Home / Self Care | Payer: Self-pay | Source: Home / Self Care | Attending: Family Medicine | Admitting: Family Medicine

## 2020-11-29 ENCOUNTER — Other Ambulatory Visit (HOSPITAL_COMMUNITY)
Admission: RE | Admit: 2020-11-29 | Discharge: 2020-11-29 | Disposition: A | Discharge status: Home / Self Care | Payer: Self-pay | Source: Ambulatory Visit | Attending: Family Medicine | Admitting: Family Medicine

## 2020-11-29 ENCOUNTER — Other Ambulatory Visit: Payer: Self-pay

## 2020-11-29 DIAGNOSIS — R369 Urethral discharge, unspecified: Secondary | ICD-10-CM

## 2020-11-29 DIAGNOSIS — Z113 Encounter for screening for infections with a predominantly sexual mode of transmission: Secondary | ICD-10-CM | POA: Insufficient documentation

## 2020-11-29 MED ORDER — DOXYCYCLINE HYCLATE 100 MG PO CAPS: 100.0000 mg | ORAL_CAPSULE | Freq: Two times a day (BID) | ORAL | 0 refills | Status: DC

## 2020-11-29 MED ORDER — CEFTRIAXONE SODIUM 500 MG IJ SOLR
500.0000 mg | Freq: Once | INTRAMUSCULAR | Status: AC
  Administered 2020-11-29: 500 mg via INTRAMUSCULAR

## 2020-11-29 NOTE — Discharge Instructions (Addendum)

## 2020-11-29 NOTE — ED Triage Notes (Signed)
Pt presents to Urgent Care w/ c/o penile discharge; pt reports being exposed to gonorrhea approx 1 week ago.
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
## 2020-12-02 LAB — CYTOLOGY, (ORAL, ANAL, URETHRAL) ANCILLARY ONLY
Chlamydia: NEGATIVE
Comment: NEGATIVE
Comment: NEGATIVE
Comment: NORMAL
Neisseria Gonorrhea: POSITIVE — AB
Trichomonas: NEGATIVE

## 2021-03-15 IMAGING — CT CT RENAL STONE PROTOCOL
2 of 4 series · 17 of 46 positions shown, 19 images · non-contrast
Comparison: CT 07/11/2017

CLINICAL DATA: Acute onset LEFT flank pain.  No hematuria.

EXAM:
CT ABDOMEN AND PELVIS WITHOUT CONTRAST
TECHNIQUE: Multidetector CT imaging of the abdomen and pelvis was performed
following the standard protocol without IV contrast.

[Series 2: axial st · axial · 0.67mm/px · z∈[+364,+754]mm · 14 of 86 slices shown, 16 images]
[im 4/86  soft-tissue]
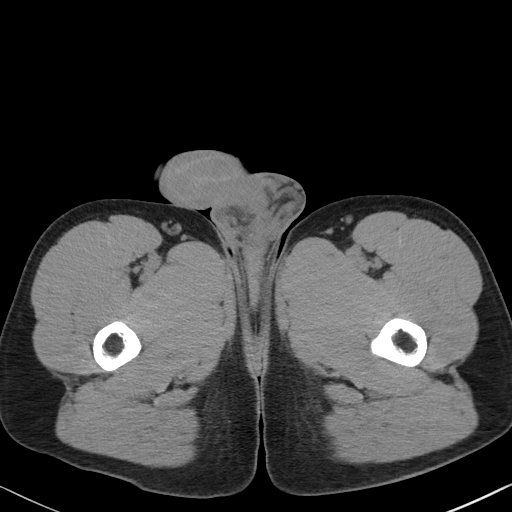
[im 4/86  bone]
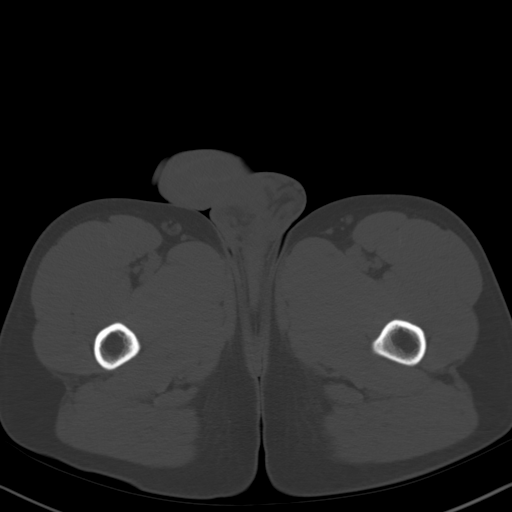
[im 10/86  soft-tissue]
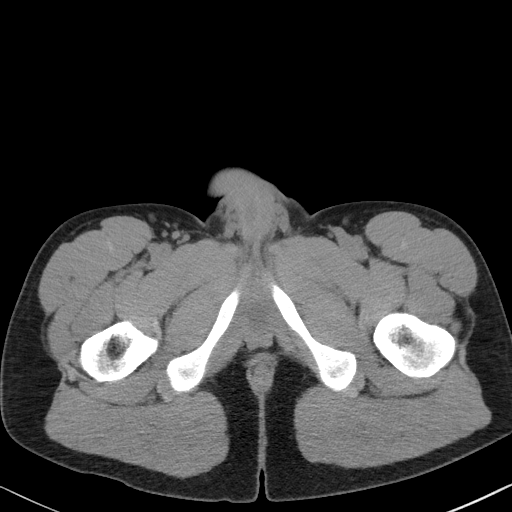
[im 17/86  soft-tissue]
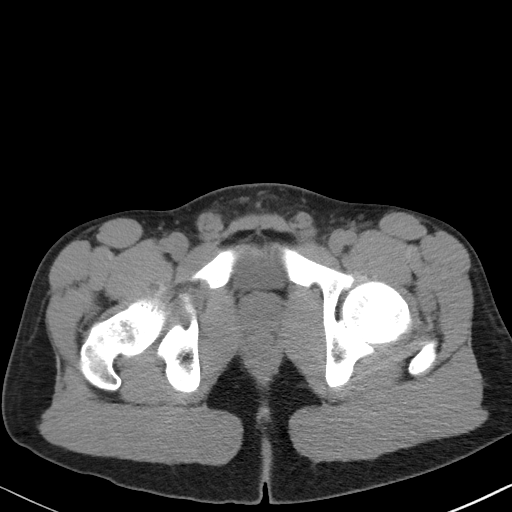
[im 23/86  soft-tissue]
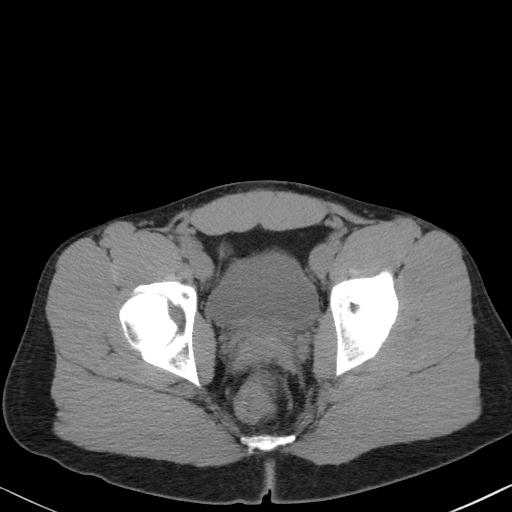
[im 30/86  soft-tissue]
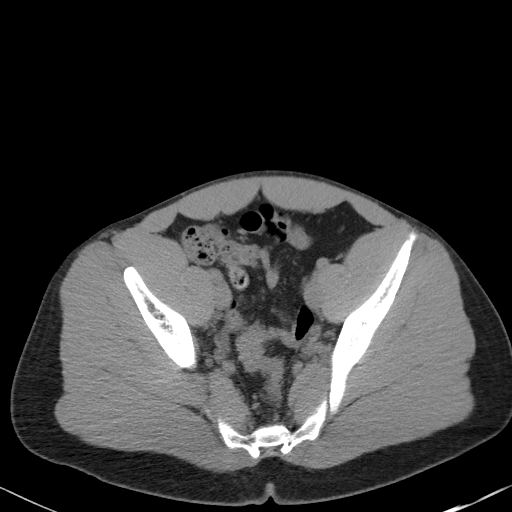
[im 33/86  soft-tissue]
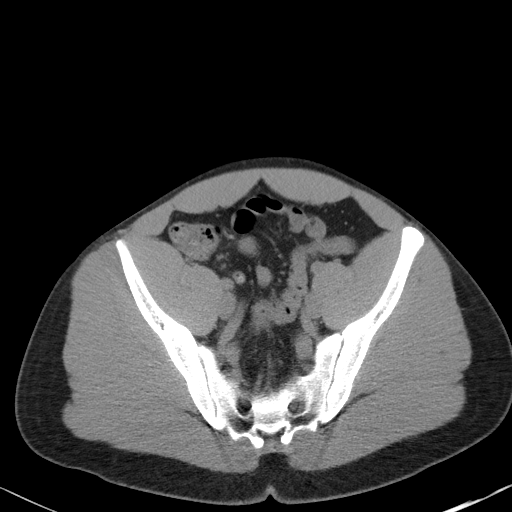
[im 40/86  soft-tissue]
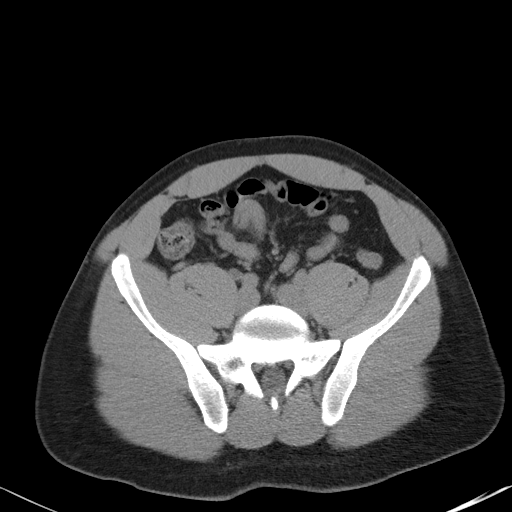
[im 46/86  soft-tissue]
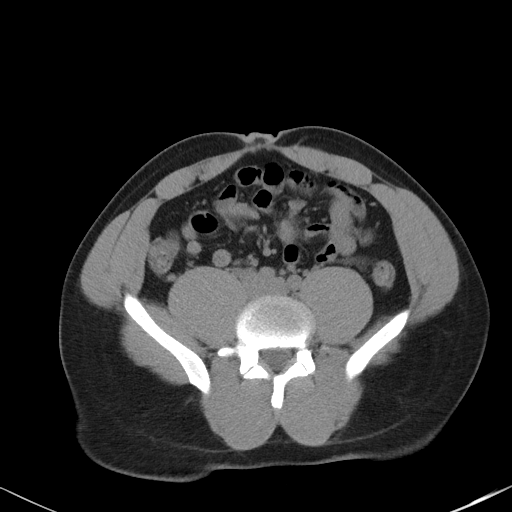
[im 53/86  soft-tissue]
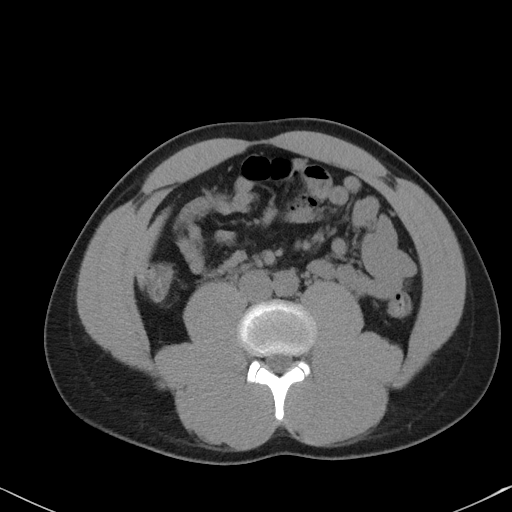
[im 53/86  bone]
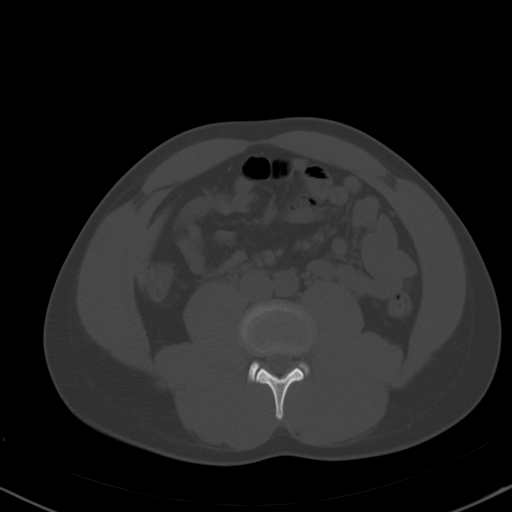
[im 56/86  soft-tissue]
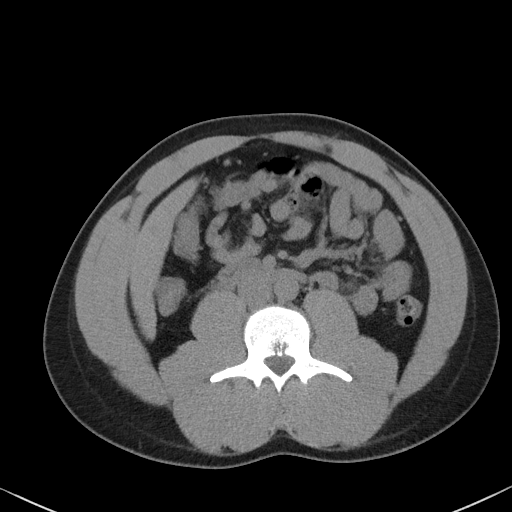
[im 63/86  soft-tissue]
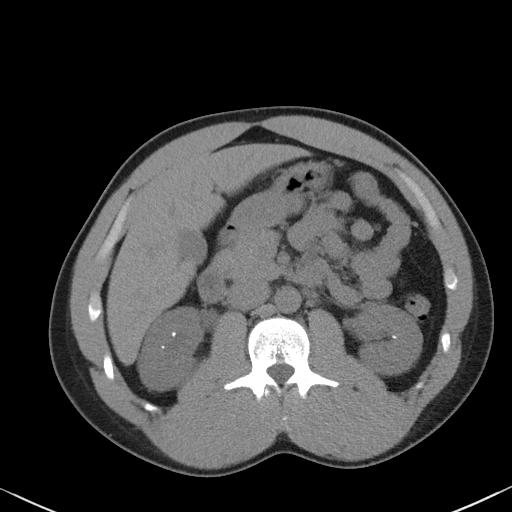
[im 69/86  soft-tissue]
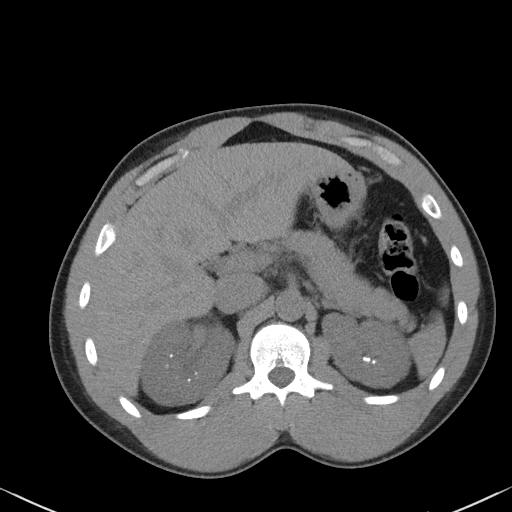
[im 76/86  soft-tissue]
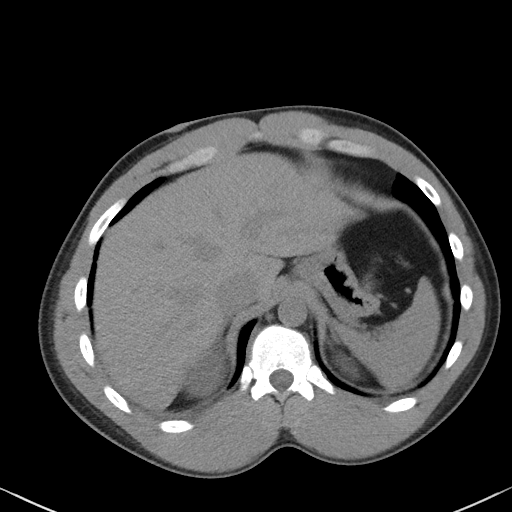
[im 82/86  soft-tissue]
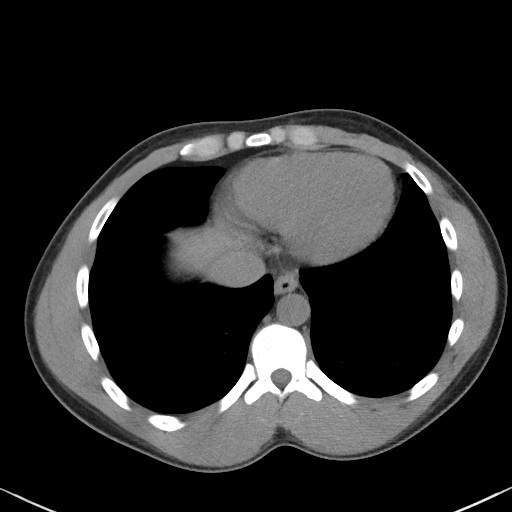

[Series 5: coronal st · coronal · 0.83mm/px · 3 of 101 slices shown]
[im 34/101  soft-tissue]
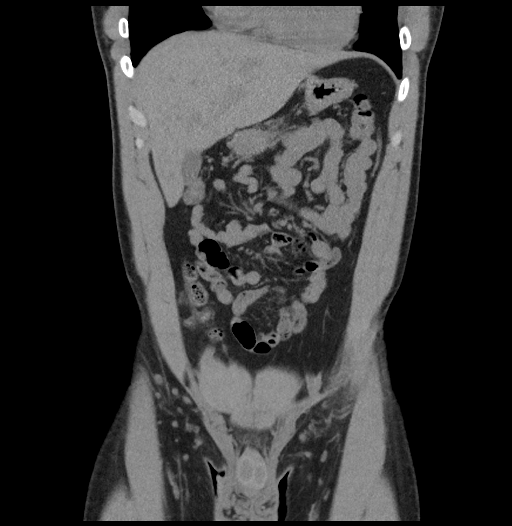
[im 45/101  soft-tissue]
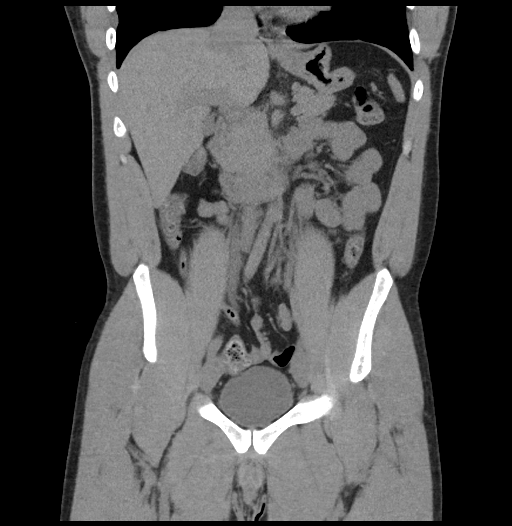
[im 56/101  soft-tissue]
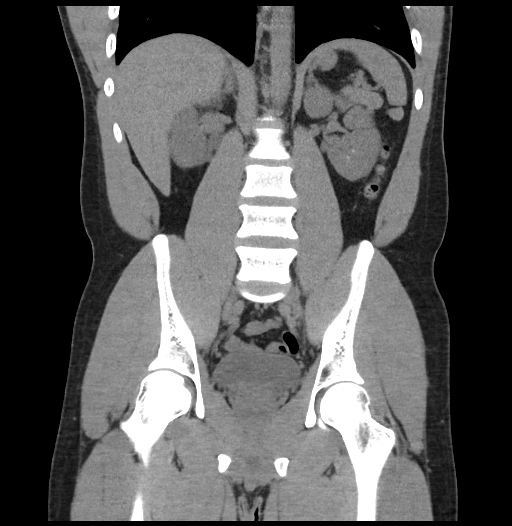

[17 of 46 positions shown; findings below may reference images not displayed]

FINDINGS: Lower chest: Lung bases are clear.

Hepatobiliary: No focal hepatic lesion. No biliary duct dilatation.
Gallbladder is normal. Common bile duct is normal.

Pancreas: Pancreas is normal. No ductal dilatation. No pancreatic
inflammation.

Spleen: Normal spleen

Adrenals/urinary tract: Adrenal glands normal.

Again demonstrated bilateral multiple renal calculi. Approximately
12 calculi in each kidney ranging size from 2-4 mm.

Compared to CT of 07/11/2017 there is a new calculus in the vicinity
of the RIGHT vesicoureteral junction measuring 3 mm (image 63/2).

Again demonstrated calculus in the deep LEFT pelvis measuring 6 mm
(image 62/2) which is not changed from comparison exam 07/11/2017.
No bladder calculi.

Mild hydroureter on the RIGHT.

Stomach/Bowel: Stomach, small bowel, appendix, and cecum are normal.
The colon and rectosigmoid colon are normal.

Vascular/Lymphatic: Abdominal aorta is normal caliber. No periportal
or retroperitoneal adenopathy. No pelvic adenopathy.

Reproductive: Prostate normal

Other: No free fluid.

Musculoskeletal: No aggressive osseous lesion.
IMPRESSION: 1. New calculus in the region of the RIGHT vesicoureteral junction
is favored a distal ureteral stone. Mild hydroureter on the RIGHT.
2. Chronic calcification in the deep LEFT pelvis is favored a
phlebolith.
3. Multiple bilateral small renal calculi.

## 2021-07-27 ENCOUNTER — Other Ambulatory Visit: Payer: Self-pay

## 2021-07-27 ENCOUNTER — Ambulatory Visit
Admission: EM | Admit: 2021-07-27 | Discharge: 2021-07-27 | Disposition: A | Discharge status: Home / Self Care | Payer: 59 | Attending: Urgent Care | Admitting: Urgent Care

## 2021-07-27 ENCOUNTER — Telehealth: Payer: Self-pay | Admitting: Emergency Medicine

## 2021-07-27 ENCOUNTER — Encounter: Payer: Self-pay | Admitting: Emergency Medicine

## 2021-07-27 DIAGNOSIS — L03314 Cellulitis of groin: Secondary | ICD-10-CM

## 2021-07-27 MED ORDER — CEPHALEXIN 500 MG PO CAPS: 500.0000 mg | ORAL_CAPSULE | Freq: Four times a day (QID) | ORAL | 0 refills | Status: AC

## 2021-07-27 MED ORDER — MUPIROCIN CALCIUM 2 % NA OINT: TOPICAL_OINTMENT | NASAL | 0 refills | Status: DC

## 2021-07-27 NOTE — Discharge Instructions (Signed)
Your symptoms are consistent with cellulitis / folliculitis of the groin.  Please fill a bathtub with warm water, or use a clean washcloth soaked in warm water; apply the warm compress at least 3 times daily. Apply antibiotic ointment topically to the affected region 3 times daily after each compress. Take the oral antibiotic 4 times daily until resolved. Monitor for improvement in symptoms, it should start to self draining. If the redness, pain, or swelling continues after completion of the antibiotics, please RTC or follow up with PCP
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
## 2021-07-27 NOTE — ED Triage Notes (Signed)
Possible ingrown hairs/small abscesses in suprapubic region x 2/3 days, painful.

## 2021-08-15 ENCOUNTER — Ambulatory Visit: Payer: 59

## 2021-08-16 ENCOUNTER — Other Ambulatory Visit: Payer: Self-pay

## 2021-08-16 ENCOUNTER — Emergency Department
Admission: RE | Admit: 2021-08-16 | Discharge: 2021-08-16 | Disposition: A | Discharge status: Home / Self Care | Payer: 59 | Source: Ambulatory Visit | Attending: Family Medicine | Admitting: Family Medicine

## 2021-08-16 ENCOUNTER — Other Ambulatory Visit (HOSPITAL_COMMUNITY)
Admission: RE | Admit: 2021-08-16 | Discharge: 2021-08-16 | Disposition: A | Discharge status: Home / Self Care | Payer: 59 | Source: Ambulatory Visit | Attending: Family Medicine | Admitting: Family Medicine

## 2021-08-16 VITALS — BP 130/83 | HR 56 | Temp 98.9°F | Resp 16

## 2021-08-16 DIAGNOSIS — Z202 Contact with and (suspected) exposure to infections with a predominantly sexual mode of transmission: Secondary | ICD-10-CM | POA: Insufficient documentation

## 2021-08-16 DIAGNOSIS — Z7251 High risk heterosexual behavior: Secondary | ICD-10-CM | POA: Diagnosis not present

## 2021-08-16 MED ORDER — CEFTRIAXONE SODIUM 500 MG IJ SOLR
500.0000 mg | Freq: Once | INTRAMUSCULAR | Status: AC
  Administered 2021-08-16: 500 mg via INTRAMUSCULAR

## 2021-08-16 MED ORDER — DOXYCYCLINE HYCLATE 100 MG PO CAPS: 100.0000 mg | ORAL_CAPSULE | Freq: Two times a day (BID) | ORAL | 0 refills | Status: DC

## 2021-08-16 NOTE — ED Triage Notes (Addendum)
Patient presents to Urgent Care with complaints of discharge since 2 days ago. Patient reports being exposed to gonorrhea and started having symptoms. Having some pain with urination. Wanting testing done for STI

## 2021-08-16 NOTE — Discharge Instructions (Addendum)
Avoid sexual relations until you have completed 7 full days of doxycycline Check MyChart for your test results.  They should be available in a couple of days
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
## 2021-08-17 LAB — CYTOLOGY, (ORAL, ANAL, URETHRAL) ANCILLARY ONLY
Chlamydia: NEGATIVE
Comment: NEGATIVE
Comment: NEGATIVE
Comment: NORMAL
Neisseria Gonorrhea: NEGATIVE
Trichomonas: NEGATIVE

## 2021-09-08 ENCOUNTER — Ambulatory Visit: Payer: 59 | Admitting: Nurse Practitioner

## 2023-04-02 ENCOUNTER — Ambulatory Visit: Payer: 59

## 2023-04-08 ENCOUNTER — Ambulatory Visit
Admission: EM | Admit: 2023-04-08 | Discharge: 2023-04-08 | Disposition: A | Discharge status: Home / Self Care | Payer: Self-pay | Attending: Family Medicine | Admitting: Family Medicine

## 2023-04-08 ENCOUNTER — Inpatient Hospital Stay: Admission: RE | Admit: 2023-04-08 | Payer: Self-pay | Source: Ambulatory Visit

## 2023-04-08 ENCOUNTER — Encounter: Payer: Self-pay | Admitting: Emergency Medicine

## 2023-04-08 DIAGNOSIS — B353 Tinea pedis: Secondary | ICD-10-CM

## 2023-04-08 DIAGNOSIS — Z202 Contact with and (suspected) exposure to infections with a predominantly sexual mode of transmission: Secondary | ICD-10-CM

## 2023-04-08 DIAGNOSIS — Z113 Encounter for screening for infections with a predominantly sexual mode of transmission: Secondary | ICD-10-CM | POA: Insufficient documentation

## 2023-04-08 DIAGNOSIS — R369 Urethral discharge, unspecified: Secondary | ICD-10-CM

## 2023-04-08 MED ORDER — TOLNAFTATE 1 % EX CREA: 1.0000 | TOPICAL_CREAM | Freq: Two times a day (BID) | CUTANEOUS | 0 refills | Status: AC

## 2023-04-08 MED ORDER — MUPIROCIN CALCIUM 2 % EX CREA: 1.0000 | TOPICAL_CREAM | Freq: Two times a day (BID) | CUTANEOUS | 0 refills | Status: AC
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
## 2023-04-08 NOTE — ED Triage Notes (Signed)
Noticed a cut between toes a few days ago. Wants it checked to r/o infection. Has been using anti-fungal/athletes foot treatment OTC at home to try and help. Denies diabetic history or numbness/tingling in extremities. Broken skin with a white base observed between fourth and fifth toes on left foot. Denies pain in the area.

## 2023-04-08 NOTE — Discharge Instructions (Signed)
Avoid sexual contact until lab results return negative, or after treatment completed if necessary.

## 2023-04-11 LAB — CYTOLOGY, (ORAL, ANAL, URETHRAL) ANCILLARY ONLY
Chlamydia: NEGATIVE
Comment: NEGATIVE
Comment: NEGATIVE
Comment: NORMAL
Neisseria Gonorrhea: NEGATIVE
Trichomonas: NEGATIVE

## 2023-05-05 ENCOUNTER — Ambulatory Visit
Admission: RE | Admit: 2023-05-05 | Discharge: 2023-05-05 | Disposition: A | Discharge status: Home / Self Care | Payer: Medicaid Other | Source: Ambulatory Visit | Attending: Emergency Medicine | Admitting: Emergency Medicine

## 2023-05-05 VITALS — BP 131/81 | HR 64 | Temp 98.6°F | Resp 16

## 2023-05-05 DIAGNOSIS — R300 Dysuria: Secondary | ICD-10-CM | POA: Insufficient documentation

## 2023-05-05 DIAGNOSIS — Z113 Encounter for screening for infections with a predominantly sexual mode of transmission: Secondary | ICD-10-CM | POA: Insufficient documentation

## 2023-05-05 LAB — POCT URINALYSIS DIP (MANUAL ENTRY)
Bilirubin, UA: NEGATIVE
Blood, UA: NEGATIVE
Glucose, UA: NEGATIVE mg/dL
Ketones, POC UA: NEGATIVE mg/dL
Leukocytes, UA: NEGATIVE
Nitrite, UA: NEGATIVE
Protein Ur, POC: NEGATIVE mg/dL
Spec Grav, UA: 1.025 (ref 1.010–1.025)
Urobilinogen, UA: 0.2 U/dL
pH, UA: 6 (ref 5.0–8.0)
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
## 2023-05-05 NOTE — ED Triage Notes (Signed)
Pt presents with slight burning when urinate and urinary frequency for a month. Lower back pain that started yesterday. Was tested 3 weeks ago for stds and was negative. Pt would like to do a urine test today if possible. Pt states he took a at home uti test and it was positive for leukocytes.

## 2023-05-05 NOTE — Discharge Instructions (Signed)
Your urine is normal.  Your STD tests are pending.  Do not have sexual activity until the test results are back.  Follow-up with your primary care provider if your symptoms are not improving.

## 2023-05-06 LAB — CYTOLOGY, (ORAL, ANAL, URETHRAL) ANCILLARY ONLY
Chlamydia: NEGATIVE
Comment: NEGATIVE
Comment: NEGATIVE
Comment: NORMAL
Neisseria Gonorrhea: NEGATIVE
Trichomonas: NEGATIVE

## 2023-12-16 ENCOUNTER — Ambulatory Visit
Admission: RE | Admit: 2023-12-16 | Discharge: 2023-12-16 | Disposition: A | Discharge status: Home / Self Care | Source: Ambulatory Visit | Attending: Internal Medicine | Admitting: Internal Medicine

## 2023-12-16 VITALS — BP 140/87 | HR 67 | Temp 98.6°F | Resp 17

## 2023-12-16 DIAGNOSIS — L0231 Cutaneous abscess of buttock: Secondary | ICD-10-CM

## 2023-12-16 MED ORDER — DOXYCYCLINE HYCLATE 100 MG PO CAPS: 100.0000 mg | ORAL_CAPSULE | Freq: Two times a day (BID) | ORAL | 0 refills | Status: AC

## 2023-12-16 NOTE — Discharge Instructions (Signed)
 We drained your abscess today in the clinic and left open to continue to drain.  Continue to use warm compresses to the wound to further encourage drainage from the wound.  You may do this in the shower as well with gentle compresses to the area to allow more infected material to drain.   Take antibiotic as prescribed to treat infection to the abscess.   Change your dressings frequently as the wound heals.   You may take over the counter pain medicines as needed for pain once the numbing wears off.  If you notice any worsening signs of infection such as redness, swelling, fever, or worsening drainage, please return to urgent care for reevaluation.
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
## 2023-12-16 NOTE — ED Triage Notes (Signed)
 Pt c/o rectal pain for two days/ States he thinks he has a hemorrhoid. Denies blood in stool
